# Patient Record
Sex: Male | Born: 1955 | Race: Black or African American | Hispanic: No | Marital: Married | Smoking: Current every day smoker
Health system: Southern US, Community
[De-identification: ages and names within clinical notes are randomized; demographics above are authoritative.]

## PROBLEM LIST (undated history)

## (undated) DIAGNOSIS — I10 Essential (primary) hypertension: Secondary | ICD-10-CM

## (undated) DIAGNOSIS — R7303 Prediabetes: Secondary | ICD-10-CM

## (undated) DIAGNOSIS — E785 Hyperlipidemia, unspecified: Secondary | ICD-10-CM

## (undated) DIAGNOSIS — N183 Chronic kidney disease, stage 3 (moderate): Secondary | ICD-10-CM

## (undated) HISTORY — DX: Prediabetes: R73.03

## (undated) HISTORY — DX: Essential (primary) hypertension: I10

## (undated) HISTORY — DX: Chronic kidney disease, stage 3 (moderate): N18.3

## (undated) HISTORY — DX: Hyperlipidemia, unspecified: E78.5

## (undated) HISTORY — PX: OTHER SURGICAL HISTORY: SHX169

---

## 2015-04-17 ENCOUNTER — Encounter: Payer: Self-pay | Admitting: Family Medicine

## 2015-04-17 ENCOUNTER — Ambulatory Visit (INDEPENDENT_AMBULATORY_CARE_PROVIDER_SITE_OTHER): Payer: BLUE CROSS/BLUE SHIELD | Admitting: Family Medicine

## 2015-04-17 ENCOUNTER — Ambulatory Visit (INDEPENDENT_AMBULATORY_CARE_PROVIDER_SITE_OTHER): Payer: BLUE CROSS/BLUE SHIELD

## 2015-04-17 VITALS — BP 132/75 | HR 91 | Ht 74.0 in | Wt 233.0 lb

## 2015-04-17 DIAGNOSIS — M1990 Unspecified osteoarthritis, unspecified site: Secondary | ICD-10-CM | POA: Insufficient documentation

## 2015-04-17 DIAGNOSIS — Z8619 Personal history of other infectious and parasitic diseases: Secondary | ICD-10-CM | POA: Diagnosis not present

## 2015-04-17 DIAGNOSIS — R300 Dysuria: Secondary | ICD-10-CM | POA: Diagnosis not present

## 2015-04-17 DIAGNOSIS — R109 Unspecified abdominal pain: Secondary | ICD-10-CM | POA: Diagnosis not present

## 2015-04-17 DIAGNOSIS — R935 Abnormal findings on diagnostic imaging of other abdominal regions, including retroperitoneum: Secondary | ICD-10-CM

## 2015-04-17 DIAGNOSIS — K5900 Constipation, unspecified: Secondary | ICD-10-CM | POA: Insufficient documentation

## 2015-04-17 LAB — POCT URINALYSIS DIPSTICK
Bilirubin, UA: NEGATIVE
Glucose, UA: NEGATIVE
KETONES UA: NEGATIVE
LEUKOCYTES UA: NEGATIVE
Nitrite, UA: NEGATIVE
PROTEIN UA: NEGATIVE
Spec Grav, UA: >=1.03
Urobilinogen, UA: 0.2
pH, UA: 5.5

## 2015-04-17 MED ORDER — CELECOXIB 200 MG PO CAPS: 200.0000 mg | ORAL_CAPSULE | Freq: Two times a day (BID) | ORAL | Status: DC | PRN

## 2015-04-17 NOTE — Progress Notes (Signed)
CC: Quantae Martel is a 59 y.o. male is here for Establish Care   Subjective: HPI:  Pleasant 59 year old here to establish care  Complains of dysuria that has been present for the past 2 months. It's described as a tingling in the left flank but no penile pain. He's also had a soreness in the left flank where this tingling occurs that is present throughout the day. He's had a history of kidney stones and he thinks that this pain might feel similar to stones in the past. Pain does not radiate. Nothing other than above seems to make pain better or worse. No interventions as of yet. Denies blood in his urine or change in the appearance of his urine.  He tells me that in his 75s he had hepatitis but is unsure of what subtype this was. He tells me that was treated with a single injection of some sort of medication. Since then he denies any persistent right upper quadrant pain fevers or jaundice. He does not think that he's been tested for hepatitis in the last decade.  Reports a history of osteoarthritis in the shoulders, hands, knees, and locations in his back. He seen neurosurgery and orthopedics in the past however from what he remembers there wasn't much intervention to be done other than medications. He's taken naproxen and ibuprofen neither of which have significantly improved his pain. Pain is worse after inactivity or a few days after any strainers activity like playing basketball with his family or doing yard work. He denies any joint swelling redness or warmth.  Complains of recent issues with constipation. He tells me it is not uncommon to go for 2-3 days without having a bowel movement. Symptoms are improved if he drinks dissolved Epsom salts however he cannot stand the taste of this and uses it infrequently. No other interventions as of yet. He is not certain how long the constipation is been present but its at least been daily for the last 3 months. Denies any melena or change in the caliber  of his stool.   Review of Systems - General ROS: negative for - chills, fever, night sweats, weight gain or weight loss Ophthalmic ROS: negative for - decreased vision Psychological ROS: negative for - anxiety  ENT ROS: negative for - hearing change, nasal congestion, tinnitus or allergies Hematological and Lymphatic ROS: negative for - bleeding problems, bruising or swollen lymph nodes Breast ROS: negative Respiratory ROS: no cough, shortness of breath, or wheezing Cardiovascular ROS: no chest pain or dyspnea on exertion Gastrointestinal ROS: no black or bloody stools Genito-Urinary ROS: negative for - genital discharge, genital ulcers, incontinence or abnormal bleeding from genitals Musculoskeletal ROS: negative for - joint pain or muscle pain other than that described above Neurological ROS: negative for - headaches or memory loss Dermatological ROS: negative for lumps, mole changes, rash and skin lesion changes  Past Medical History  Diagnosis Date  . Hypertension   . Hyperlipidemia     History reviewed. No pertinent past surgical history. Family History  Problem Relation Age of Onset  . Alcoholism    . Depression    . Hyperlipidemia    . Hypertension      History   Social History  . Marital Status: N/A    Spouse Name: N/A  . Number of Children: N/A  . Years of Education: N/A   Occupational History  . Not on file.   Social History Main Topics  . Smoking status: Never Smoker   . Smokeless  tobacco: Not on file  . Alcohol Use: No  . Drug Use: No  . Sexual Activity: No   Other Topics Concern  . Not on file   Social History Narrative  . No narrative on file     Objective: BP 132/75 mmHg  Pulse 91  Ht 6\' 2"  (1.88 m)  Wt 233 lb (105.688 kg)  BMI 29.90 kg/m2  General: Alert and Oriented, No Acute Distress HEENT: Pupils equal, round, reactive to light. Conjunctivae clear.  Moist mucous membranes pharynx unremarkable Lungs: Clear to auscultation bilaterally,  no wheezing/ronchi/rales.  Comfortable work of breathing. Good air movement. Cardiac: Regular rate and rhythm. Normal S1/S2.  No murmurs, rubs, nor gallops.   Abdomen: Normal bowel sounds, soft without palpable masses rebound tenderness nor guarding. Diffuse abdominal discomfort but mild in severity with deep palpation. Obese. Extremities: No peripheral edema.  Strong peripheral pulses.  Mental Status: No depression, anxiety, nor agitation. Skin: Warm and dry.  Assessment & Plan: Lorella NimrodHarvey was seen today for establish care.  Diagnoses and all orders for this visit:  Dysuria Orders: -     Urinalysis Dipstick -     Urine Culture  History of hepatitis Orders: -     Hepatitis C antibody -     Hepatitis B Surface AntiBODY -     COMPLETE METABOLIC PANEL WITH GFR  Left flank pain Orders: -     DG Abd 2 Views; Future  Osteoarthritis, unspecified osteoarthritis type, unspecified site Orders: -     celecoxib (CELEBREX) 200 MG capsule; Take 1 capsule (200 mg total) by mouth 2 (two) times daily as needed. Joint/Back pain.  Constipation, unspecified constipation type   Dysuria: Urinalysis obtained low suspicion for UTI, on chart review these had trace lysed blood on urinalysis stemming back to 2012 however a microscopy study showed 0 red blood cells.  Low suspicion for his trace lysed blood result today reflecting UTI. Encouraged increase hydration due to high specific gravity History of hepatitis:: Checking liver function and screening for hepatitis B and C infections Left flank pain: Obtaining plain films of the abdomen to screen for nephrolithiasis Osteoporosis: Begin trial of Celebrex, requesting outside records for films and imaging Constipation: Plain films of the abdomen to rule out obstruction and if this is normal will provide with linzess  Return if symptoms worsen or fail to improve.

## 2015-04-18 ENCOUNTER — Telehealth: Payer: Self-pay | Admitting: Family Medicine

## 2015-04-18 ENCOUNTER — Encounter: Payer: Self-pay | Admitting: Family Medicine

## 2015-04-18 DIAGNOSIS — F32A Depression, unspecified: Secondary | ICD-10-CM | POA: Insufficient documentation

## 2015-04-18 DIAGNOSIS — N183 Chronic kidney disease, stage 3 unspecified: Secondary | ICD-10-CM | POA: Insufficient documentation

## 2015-04-18 DIAGNOSIS — F329 Major depressive disorder, single episode, unspecified: Secondary | ICD-10-CM | POA: Insufficient documentation

## 2015-04-18 DIAGNOSIS — N200 Calculus of kidney: Secondary | ICD-10-CM

## 2015-04-18 DIAGNOSIS — M1990 Unspecified osteoarthritis, unspecified site: Secondary | ICD-10-CM | POA: Insufficient documentation

## 2015-04-18 DIAGNOSIS — I10 Essential (primary) hypertension: Secondary | ICD-10-CM | POA: Insufficient documentation

## 2015-04-18 HISTORY — DX: Chronic kidney disease, stage 3 unspecified: N18.30

## 2015-04-18 LAB — COMPLETE METABOLIC PANEL WITH GFR
ALBUMIN: 4.6 g/dL (ref 3.5–5.2)
ALT: 28 U/L (ref 0–53)
AST: 24 U/L (ref 0–37)
Alkaline Phosphatase: 55 U/L (ref 39–117)
BUN: 30 mg/dL — ABNORMAL HIGH (ref 6–23)
CO2: 26 mEq/L (ref 19–32)
Calcium: 10.1 mg/dL (ref 8.4–10.5)
Chloride: 104 mEq/L (ref 96–112)
Creat: 1.39 mg/dL — ABNORMAL HIGH (ref 0.50–1.35)
GFR, EST AFRICAN AMERICAN: 64 mL/min
GFR, Est Non African American: 55 mL/min — ABNORMAL LOW
Glucose, Bld: 84 mg/dL (ref 70–99)
POTASSIUM: 4.5 meq/L (ref 3.5–5.3)
SODIUM: 139 meq/L (ref 135–145)
TOTAL PROTEIN: 7.9 g/dL (ref 6.0–8.3)
Total Bilirubin: 0.5 mg/dL (ref 0.2–1.2)

## 2015-04-18 LAB — HEPATITIS C ANTIBODY: HCV Ab: NEGATIVE

## 2015-04-18 LAB — HEPATITIS B SURFACE ANTIBODY,QUALITATIVE: HEP B S AB: NEGATIVE

## 2015-04-18 MED ORDER — LINACLOTIDE 145 MCG PO CAPS: 145.0000 ug | ORAL_CAPSULE | Freq: Every day | ORAL | Status: DC

## 2015-04-18 NOTE — Telephone Encounter (Signed)
Sue Lushndrea, Will you please let patient know that his xray was suggestive of a kidney stone in the left ureter where it empties into the bladder.  I'd recommend he have a ultrasound performed to evaluate if this is causing any significant obstruction, an order has been placed for this to be performed at our [place] location. Also, there was no bowel obstruction therefore I'd recommend starting a medication called linzess to help with bowel regularity, a Rx was sent to [facility] in [place]. F/U will be determined based on the ultrasound.

## 2015-04-18 NOTE — Telephone Encounter (Signed)
Pt.notified

## 2015-04-19 LAB — URINE CULTURE
Colony Count: NO GROWTH
ORGANISM ID, BACTERIA: NO GROWTH

## 2015-04-21 ENCOUNTER — Telehealth: Payer: Self-pay | Admitting: Family Medicine

## 2015-04-21 NOTE — Telephone Encounter (Signed)
Received fax for pa on celecoxib 200 mg sent through cover my meds and medication is approved. - CF

## 2015-04-21 NOTE — Telephone Encounter (Signed)
Celecoxib is approved from 03/22/2015 - 04/20/2016. Case # [id number]. - CF

## 2015-04-23 ENCOUNTER — Ambulatory Visit (INDEPENDENT_AMBULATORY_CARE_PROVIDER_SITE_OTHER): Payer: BLUE CROSS/BLUE SHIELD

## 2015-04-23 DIAGNOSIS — N200 Calculus of kidney: Secondary | ICD-10-CM | POA: Diagnosis not present

## 2015-04-24 ENCOUNTER — Telehealth: Payer: Self-pay | Admitting: Family Medicine

## 2015-04-24 DIAGNOSIS — N281 Cyst of kidney, acquired: Secondary | ICD-10-CM

## 2015-04-24 NOTE — Telephone Encounter (Signed)
Andrew Hanson, Will you please let patient know that his ultrasound showed that he no longer has any signs of a kidney stone causing an obstruction.  There were signs of some cysts on both kidneys which are very common and usually benign however the radiologist viewing his images has recommended a non-urgent MRI to further characterize these cysts and to ensure they are nothing to worry about.  I'll work on putting in an order for this.  (Can you also call radiology and ask them how to put in an order for a "Renal protocol MRI", I can't seem to find a clear order for this in Epic)

## 2015-04-24 NOTE — Telephone Encounter (Signed)
Pt.notified

## 2015-04-25 NOTE — Telephone Encounter (Signed)
Order placed. (i had called imaging and they advised on how to place order)

## 2015-04-25 NOTE — Telephone Encounter (Signed)
Sue Lushndrea, Can you also call radiology and ask them how to put in an order for a "Renal protocol MRI", I can't seem to find a clear order for this in Epic

## 2015-04-28 ENCOUNTER — Telehealth: Payer: Self-pay | Admitting: *Deleted

## 2015-04-28 ENCOUNTER — Ambulatory Visit: Payer: BLUE CROSS/BLUE SHIELD

## 2015-04-28 MED ORDER — GADOBENATE DIMEGLUMINE 529 MG/ML IV SOLN
20.0000 mL | Freq: Once | INTRAVENOUS | Status: AC | PRN
  Administered 2015-04-28: 20 mL via INTRAVENOUS

## 2015-04-28 NOTE — Telephone Encounter (Signed)
error 

## 2015-04-30 ENCOUNTER — Telehealth: Payer: Self-pay | Admitting: Family Medicine

## 2015-04-30 DIAGNOSIS — R109 Unspecified abdominal pain: Secondary | ICD-10-CM | POA: Insufficient documentation

## 2015-04-30 NOTE — Telephone Encounter (Signed)
Referral placed per result note (MRI)

## 2015-05-06 ENCOUNTER — Ambulatory Visit: Payer: BLUE CROSS/BLUE SHIELD | Admitting: Physical Therapy

## 2015-06-26 ENCOUNTER — Other Ambulatory Visit: Payer: Self-pay | Admitting: *Deleted

## 2015-06-26 MED ORDER — LISINOPRIL 20 MG PO TABS: 20.0000 mg | ORAL_TABLET | Freq: Every day | ORAL | Status: DC

## 2015-07-07 ENCOUNTER — Ambulatory Visit: Payer: BLUE CROSS/BLUE SHIELD | Admitting: Family Medicine

## 2015-07-15 ENCOUNTER — Ambulatory Visit (INDEPENDENT_AMBULATORY_CARE_PROVIDER_SITE_OTHER): Payer: BLUE CROSS/BLUE SHIELD | Admitting: Family Medicine

## 2015-07-15 ENCOUNTER — Ambulatory Visit (INDEPENDENT_AMBULATORY_CARE_PROVIDER_SITE_OTHER): Payer: BLUE CROSS/BLUE SHIELD

## 2015-07-15 ENCOUNTER — Encounter: Payer: Self-pay | Admitting: Family Medicine

## 2015-07-15 DIAGNOSIS — R109 Unspecified abdominal pain: Secondary | ICD-10-CM | POA: Diagnosis not present

## 2015-07-15 DIAGNOSIS — M25511 Pain in right shoulder: Secondary | ICD-10-CM | POA: Diagnosis not present

## 2015-07-15 DIAGNOSIS — I10 Essential (primary) hypertension: Secondary | ICD-10-CM

## 2015-07-15 DIAGNOSIS — G5602 Carpal tunnel syndrome, left upper limb: Secondary | ICD-10-CM

## 2015-07-15 LAB — LIPID PANEL
CHOLESTEROL: 176 mg/dL (ref 125–200)
HDL: 26 mg/dL — ABNORMAL LOW (ref >=40)
LDL CALC: 85 mg/dL (ref <130)
Total CHOL/HDL Ratio: 6.8 Ratio — ABNORMAL HIGH (ref <=5.0)
Triglycerides: 327 mg/dL — ABNORMAL HIGH (ref <150)
VLDL: 65 mg/dL — AB (ref <30)

## 2015-07-15 MED ORDER — LISINOPRIL 20 MG PO TABS: 20.0000 mg | ORAL_TABLET | Freq: Every day | ORAL | Status: DC
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
## 2015-07-16 ENCOUNTER — Telehealth: Payer: Self-pay | Admitting: Family Medicine

## 2015-07-16 DIAGNOSIS — E781 Pure hyperglyceridemia: Secondary | ICD-10-CM | POA: Insufficient documentation

## 2015-07-16 NOTE — Telephone Encounter (Signed)
Sue Lush, Will you please let patient know that his LDL cholesterol is well controlled but his triglyceride level was moderately elevated which can lead to liver and pancreatic inflammation over time.  This can be improved with an OTC fish oil capsule at a dose of 1g twice a day. We'll want to recheck this in three months.

## 2015-07-16 NOTE — Telephone Encounter (Signed)
Left message on vm

## 2015-10-16 ENCOUNTER — Other Ambulatory Visit: Payer: Self-pay

## 2015-10-16 MED ORDER — OMEPRAZOLE 20 MG PO CPDR: 20.0000 mg | DELAYED_RELEASE_CAPSULE | Freq: Two times a day (BID) | ORAL | Status: DC

## 2015-11-17 ENCOUNTER — Other Ambulatory Visit: Payer: Self-pay

## 2015-11-17 MED ORDER — OMEPRAZOLE 20 MG PO CPDR: 20.0000 mg | DELAYED_RELEASE_CAPSULE | Freq: Two times a day (BID) | ORAL | Status: DC

## 2016-01-07 IMAGING — MR MR ABDOMEN WO/W CM
17 of 18 series · 43 of 48 positions shown · IV contrast (20 ML MULTIHANCE)
Comparison: None.

CLINICAL DATA: Indeterminate renal lesion on ultrasound
examination. Left rib pain in left abdominal pain.

EXAM:
MRI ABDOMEN WITHOUT AND WITH CONTRAST
TECHNIQUE: Multiplanar multisequence MR imaging of the abdomen was performed
both before and after the administration of intravenous contrast.
CONTRAST:  20mL MULTIHANCE GADOBENATE DIMEGLUMINE 529 MG/ML IV SOLN

[Series 2: cor ssfse / · coronal · 7.0mm · 1.48mm/px · 1 of 40 slices shown]
[im 1/40]
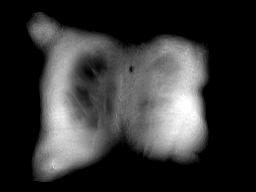

[Series 3: T2 · axial · 6.0mm · 1.48mm/px · 1 of 30 slices shown]
[im 1/30]
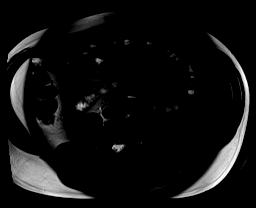

[Series 6: DWI · axial · 6.0mm · 2.00mm/px · z∈[-45,+193]mm · 4 of 102 slices shown (1 of 2)]
[im 1/102]
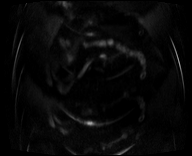
[im 34/102]
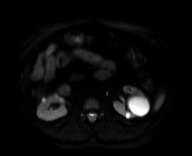
[im 68/102]
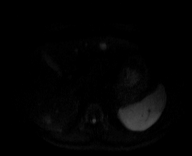
[im 102/102]
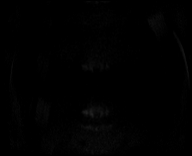

[Series 7: DWI · axial · 6.0mm · 2.00mm/px · z∈[-45,+193]mm · 2 of 34 slices shown (2 of 2)]
[im 1/34]
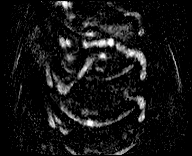
[im 34/34]
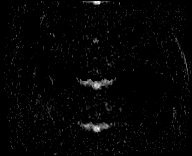

[Series 8: bSSFP · axial · 6.0mm · 0.74mm/px · z∈[-68,+184]mm · 2 of 36 slices shown]
[im 1/36]
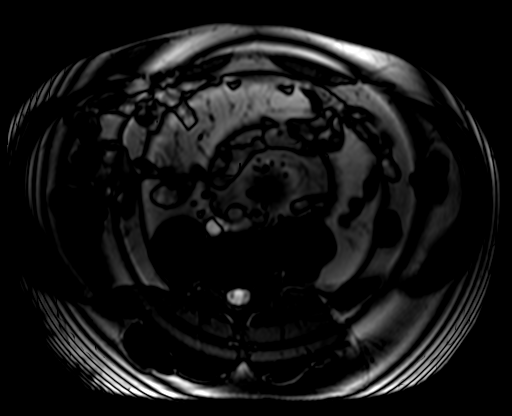
[im 36/36]
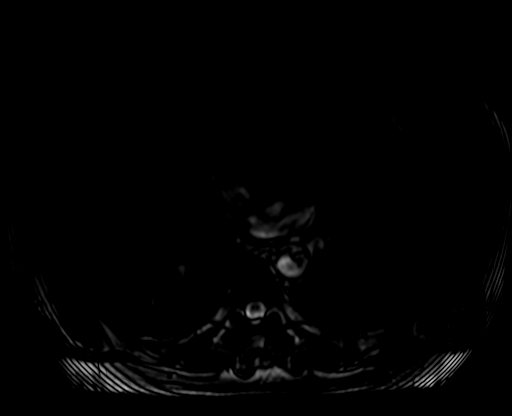

[Series 9: axial dynamic pre · axial · non-contrast · 4.0mm · 1.19mm/px · z∈[-68,+184]mm · 3 of 64 slices shown]
[im 1/64]
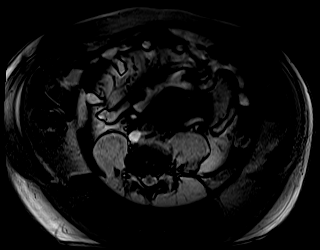
[im 32/64]
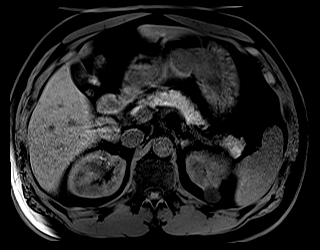
[im 64/64]
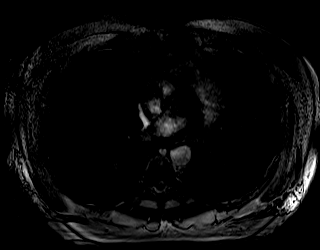

[Series 10: axial dynamic post · axial · 4.0mm · 1.19mm/px · z∈[-68,+184]mm · 3 of 64 slices shown (1 of 3)]
[im 1/64]
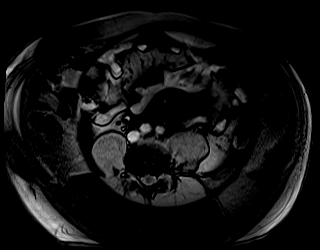
[im 32/64]
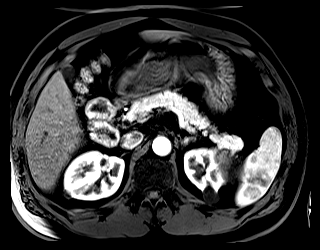
[im 64/64]
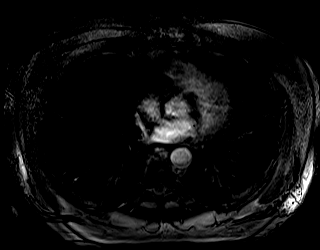

[Series 11: axial dynamic post · axial · 4.0mm · 1.19mm/px · z∈[-68,+184]mm · 3 of 64 slices shown (2 of 3)]
[im 1/64]
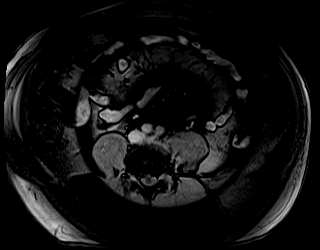
[im 32/64]
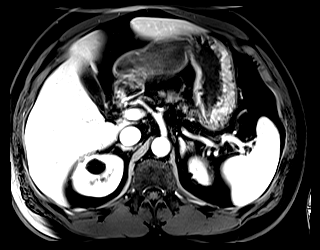
[im 64/64]
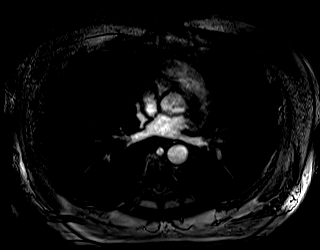

[Series 12: axial dynamic post · axial · 4.0mm · 1.19mm/px · z∈[-68,+184]mm · 3 of 64 slices shown (3 of 3)]
[im 1/64]
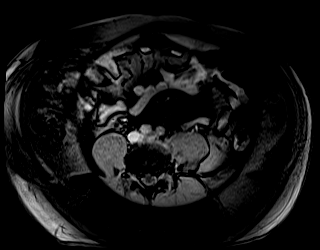
[im 32/64]
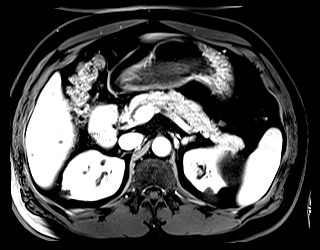
[im 64/64]
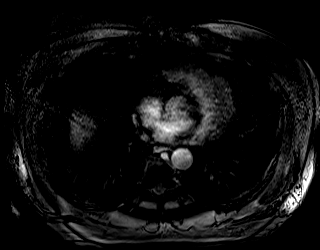

[Series 14: axial ssfse / · axial · 6.0mm · 1.19mm/px · z∈[-68,+184]mm · 2 of 36 slices shown]
[im 1/36]
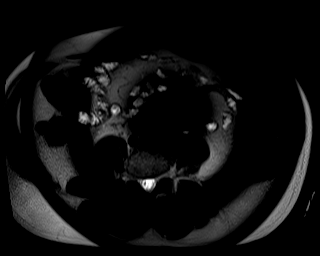
[im 36/36]
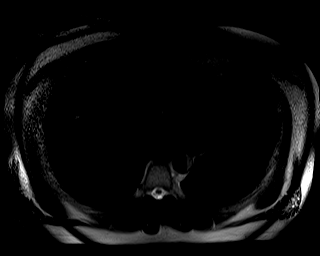

[Series 15: axial dynamic 3 · axial · 4.0mm · 1.19mm/px · z∈[-68,+184]mm · 3 of 64 slices shown]
[im 1/64]
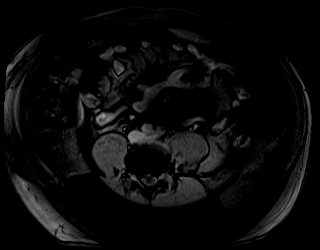
[im 32/64]
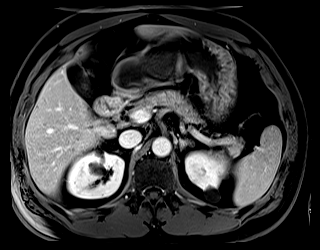
[im 64/64]
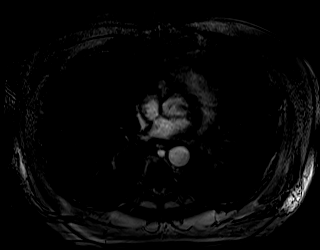

[Series 100: out of phase · axial · 6.0mm · 0.74mm/px · 1 of 30 slices shown]
[im 1/30]
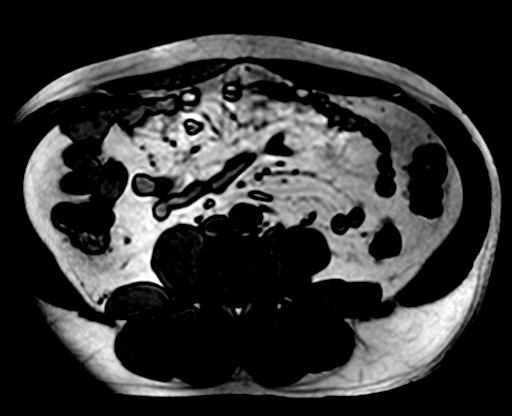

[Series 101: in phase · axial · 6.0mm · 0.74mm/px · z∈[-52,+156]mm · 3 of 60 slices shown]
[im 1/60]
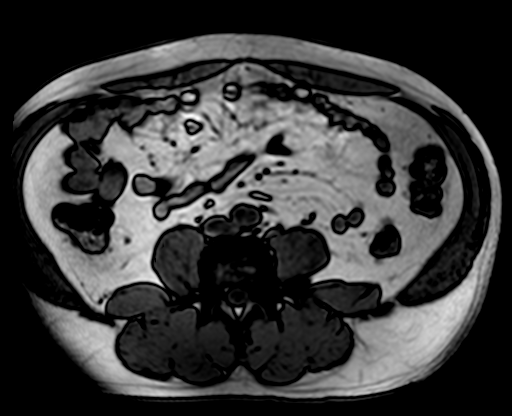
[im 30/60]
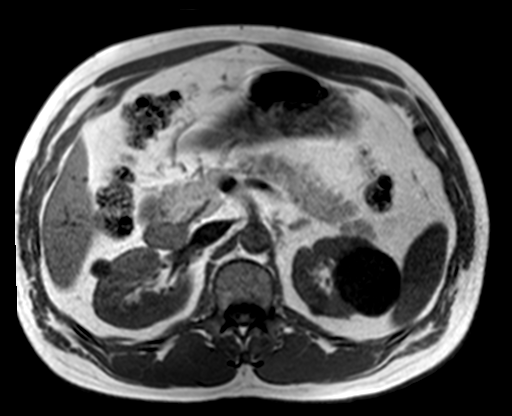
[im 60/60]
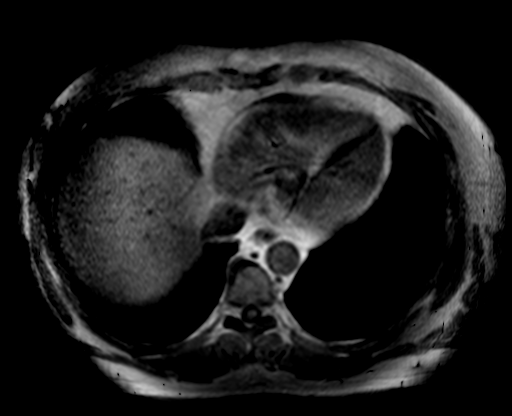

[Series 102: sub_s10-s9_1 · axial · 4.0mm · 1.19mm/px · z∈[-68,+184]mm · 3 of 64 slices shown]
[im 1/64]
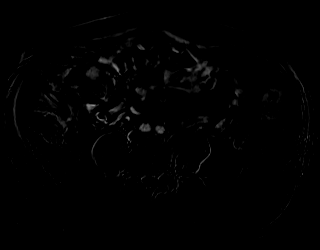
[im 32/64]
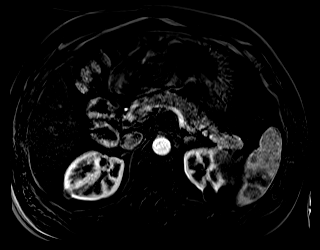
[im 64/64]
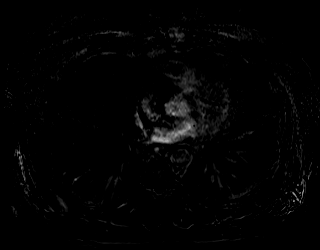

[Series 103: 45 sec sub · axial · 4.0mm · 1.19mm/px · z∈[-68,+184]mm · 3 of 64 slices shown]
[im 1/64]
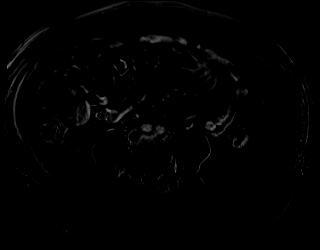
[im 32/64]
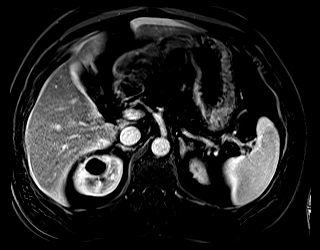
[im 64/64]
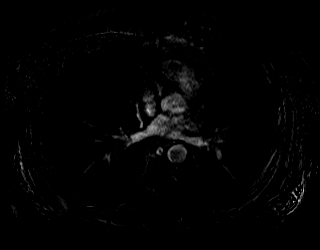

[Series 104: 90 sec sub · axial · 4.0mm · 1.19mm/px · z∈[-68,+184]mm · 3 of 64 slices shown]
[im 1/64]
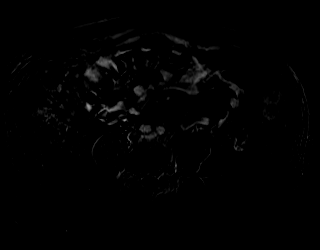
[im 32/64]
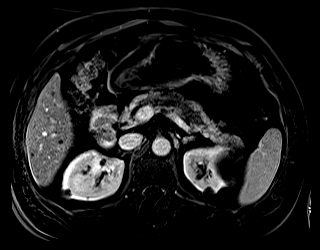
[im 64/64]
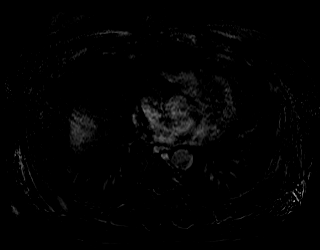

[Series 105: 3 min sub · axial · 4.0mm · 1.19mm/px · z∈[-68,+184]mm · 3 of 64 slices shown]
[im 1/64]
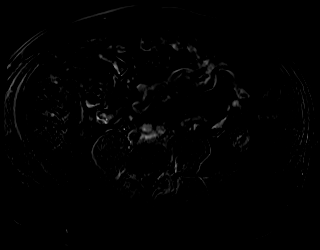
[im 32/64]
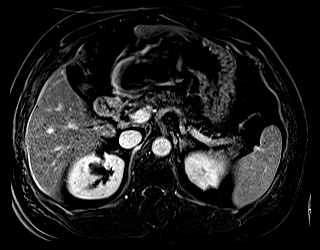
[im 64/64]
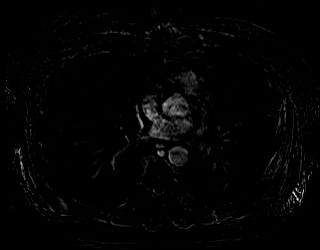

[43 of 48 positions shown; findings below may reference images not displayed]

FINDINGS: Lower chest:  Lung bases are clear.

Hepatobiliary: There multiple cystic lesions within the liver
parenchyma which demonstrate no post-contrast enhancement consistent
with benign hepatic cysts. No biliary duct dilatation. Gallbladder
is normal.

Pancreas: Normal pancreatic parenchymal intensity. No ductal
dilatation or inflammation.

Spleen: Normal spleen.

Adrenals/urinary tract: Adrenal glands are normal. There bilateral
round renal parenchymal lesions which have high signal intensity on
T2 weighted imaging and demonstrate no post-contrast enhancement
consistent with nonenhancing renal cysts.

Stomach/Bowel: Stomach and limited of the small bowel is
unremarkable

Vascular/Lymphatic: Abdominal aortic normal caliber. No
retroperitoneal periportal lymphadenopathy.

Musculoskeletal: No aggressive osseous lesion
IMPRESSION: 1. Numerous Bilateral nonenhancing Bosniak 1 renal cysts.
2. Numerous hepatic cyst.

## 2016-01-09 ENCOUNTER — Other Ambulatory Visit: Payer: Self-pay

## 2016-01-09 MED ORDER — OMEPRAZOLE 20 MG PO CPDR: 20.0000 mg | DELAYED_RELEASE_CAPSULE | Freq: Two times a day (BID) | ORAL | Status: DC

## 2016-01-14 ENCOUNTER — Encounter: Payer: Self-pay | Admitting: Family Medicine

## 2016-01-14 ENCOUNTER — Ambulatory Visit (INDEPENDENT_AMBULATORY_CARE_PROVIDER_SITE_OTHER): Payer: BLUE CROSS/BLUE SHIELD | Admitting: Family Medicine

## 2016-01-14 DIAGNOSIS — Z111 Encounter for screening for respiratory tuberculosis: Secondary | ICD-10-CM

## 2016-01-14 DIAGNOSIS — R05 Cough: Secondary | ICD-10-CM | POA: Diagnosis not present

## 2016-01-14 DIAGNOSIS — R61 Generalized hyperhidrosis: Secondary | ICD-10-CM

## 2016-01-14 DIAGNOSIS — R059 Cough, unspecified: Secondary | ICD-10-CM

## 2016-01-14 DIAGNOSIS — Z86010 Personal history of colonic polyps: Secondary | ICD-10-CM

## 2016-01-14 DIAGNOSIS — Z800 Family history of malignant neoplasm of digestive organs: Secondary | ICD-10-CM

## 2016-01-14 LAB — CBC WITH DIFFERENTIAL/PLATELET
BASOS PCT: 1 % (ref 0–1)
Basophils Absolute: 0 10*3/uL (ref 0.0–0.1)
Eosinophils Absolute: 0.1 10*3/uL (ref 0.0–0.7)
Eosinophils Relative: 2 % (ref 0–5)
HCT: 41.4 % (ref 39.0–52.0)
Hemoglobin: 14.3 g/dL (ref 13.0–17.0)
Lymphocytes Relative: 30 % (ref 12–46)
Lymphs Abs: 1.1 10*3/uL (ref 0.7–4.0)
MCH: 29.5 pg (ref 26.0–34.0)
MCHC: 34.5 g/dL (ref 30.0–36.0)
MCV: 85.4 fL (ref 78.0–100.0)
Monocytes Absolute: 0.2 10*3/uL (ref 0.1–1.0)
Monocytes Relative: 6 % (ref 3–12)
NEUTROS ABS: 2.3 10*3/uL (ref 1.7–7.7)
Neutrophils Relative %: 61 % (ref 43–77)
Platelets: 173 10*3/uL (ref 150–400)
RBC: 4.85 MIL/uL (ref 4.22–5.81)
RDW: 14.2 % (ref 11.5–15.5)
WBC: 3.8 10*3/uL — AB (ref 4.0–10.5)

## 2016-01-14 LAB — COMPLETE METABOLIC PANEL WITH GFR
ALT: 22 U/L (ref 9–46)
AST: 13 U/L (ref 10–35)
Albumin: 4 g/dL (ref 3.6–5.1)
Alkaline Phosphatase: 58 U/L (ref 40–115)
BUN: 17 mg/dL (ref 7–25)
CALCIUM: 9.6 mg/dL (ref 8.6–10.3)
CHLORIDE: 99 mmol/L (ref 98–110)
CO2: 26 mmol/L (ref 20–31)
Creat: 1.33 mg/dL (ref 0.70–1.33)
GFR, Est African American: 67 mL/min (ref >=60)
GFR, Est Non African American: 58 mL/min — ABNORMAL LOW (ref >=60)
GLUCOSE: 218 mg/dL — AB (ref 65–99)
POTASSIUM: 4.3 mmol/L (ref 3.5–5.3)
SODIUM: 136 mmol/L (ref 135–146)
Total Bilirubin: 0.6 mg/dL (ref 0.2–1.2)
Total Protein: 7.2 g/dL (ref 6.1–8.1)

## 2016-01-14 MED ORDER — BUDESONIDE-FORMOTEROL FUMARATE 160-4.5 MCG/ACT IN AERO: 2.0000 | INHALATION_SPRAY | Freq: Two times a day (BID) | RESPIRATORY_TRACT | Status: DC

## 2016-01-14 MED ORDER — TUBERCULIN PPD 5 UNIT/0.1ML ID SOLN
5.0000 [IU] | Freq: Once | INTRADERMAL | Status: DC
  Administered 2016-01-14: 5 [IU] via INTRADERMAL

## 2016-01-14 NOTE — Progress Notes (Signed)
CC: Andrew Hanson is a 60 y.o. male is here for Night Sweats and Cough   Subjective: HPI:  Over the past 3 months on a nightly basis he has been Waking up 2 night sweats. He finds himself covered with sweat, slightly chilly. He's tried changing sheets in sleeping attire but nothing seems to make symptoms better or worse. Over the past 2 weeks he's had a nonproductive cough with some shortness of breath but this had been absent up until 2 weeks ago. Denies any recent travel or known exposure to tuberculosis. He denies any chest pain. No interventions other than that described above. Denies unintentional weight loss or blood in sputum. He denies fevers, wheezing, skin changes, nausea or change in appetite  He has a family history of colon cancer. He had polyps removed 3 years ago which were benign and believes he is due for follow-up colonoscopy. He denies any constipation or diarrhea. No abdominal pain.  Review Of Systems Outlined In HPI  Past Medical History  Diagnosis Date  . Hypertension   . Hyperlipidemia     No past surgical history on file. Family History  Problem Relation Age of Onset  . Alcoholism    . Depression    . Hyperlipidemia    . Hypertension      Social History   Social History  . Marital Status: Married    Spouse Name: N/A  . Number of Children: N/A  . Years of Education: N/A   Occupational History  . Not on file.   Social History Main Topics  . Smoking status: Never Smoker   . Smokeless tobacco: Not on file  . Alcohol Use: No  . Drug Use: No  . Sexual Activity: No   Other Topics Concern  . Not on file   Social History Narrative     Objective: BP 135/86 mmHg  Pulse 90  Wt 253 lb (114.76 kg)  General: Alert and Oriented, No Acute Distress HEENT: Pupils equal, round, reactive to light. Conjunctivae clear.  Pharynx unremarkable Lungs: Clear to auscultation bilaterally, no wheezing/ronchi/rales.  Comfortable work of breathing. Good air  movement. Cardiac: Regular rate and rhythm. Normal S1/S2.  No murmurs, rubs, nor gallops.   Extremities: No peripheral edema.  Strong peripheral pulses.  Mental Status: No depression, anxiety, nor agitation. Skin: Warm and dry.  Assessment & Plan: Shadoe was seen today for night sweats and cough.  Diagnoses and all orders for this visit:  Night sweats -     CBC with Differential -     TSH -     COMPLETE METABOLIC PANEL WITH GFR -     PPD  Family history of colon cancer  Personal history of colonic polyps -     Ambulatory referral to Gastroenterology  Cough -     budesonide-formoterol (SYMBICORT) 160-4.5 MCG/ACT inhaler; Inhale 2 puffs into the lungs 2 (two) times daily.  PPD screening test -     tuberculin injection 5 Units; Inject 0.1 mLs (5 Units total) into the skin once.   differential remains broad for night sweats, will rule out tuberculosis with PPD test placed today, rule out hyperthyroidism obtaining white count with differential to screen for possible infection. Rule out metabolic abnormality.   cough: He was given samples of Symbicort due to suspicion of bronchitis with a history of smoking.  Return for Nurse Visit in 2-3 Days.

## 2016-01-15 ENCOUNTER — Telehealth: Payer: Self-pay | Admitting: Family Medicine

## 2016-01-15 DIAGNOSIS — R739 Hyperglycemia, unspecified: Secondary | ICD-10-CM

## 2016-01-15 LAB — TSH: TSH: 0.624 u[IU]/mL (ref 0.350–4.500)

## 2016-01-15 NOTE — Telephone Encounter (Signed)
Will you please let patient know that his blood work was normal other than a moderately elevated blood sugar. To further look in to this I'd recommend checking a three month average blood sugar (A1c), can you please see if the lab can add this on? If not I'll print off a new lab order.

## 2016-01-15 NOTE — Telephone Encounter (Signed)
PT NOTIFIED  

## 2016-01-16 ENCOUNTER — Encounter: Payer: Self-pay | Admitting: Family Medicine

## 2016-01-16 ENCOUNTER — Ambulatory Visit (INDEPENDENT_AMBULATORY_CARE_PROVIDER_SITE_OTHER): Payer: BLUE CROSS/BLUE SHIELD | Admitting: Family Medicine

## 2016-01-16 DIAGNOSIS — Z111 Encounter for screening for respiratory tuberculosis: Secondary | ICD-10-CM

## 2016-01-16 DIAGNOSIS — Z7689 Persons encountering health services in other specified circumstances: Secondary | ICD-10-CM

## 2016-01-16 LAB — HEMOGLOBIN A1C
Hgb A1c MFr Bld: 7.9 % — ABNORMAL HIGH (ref <5.7)
Mean Plasma Glucose: 180 mg/dL — ABNORMAL HIGH (ref <117)

## 2016-01-16 LAB — TB SKIN TEST
Induration: 0 mm
TB SKIN TEST: NEGATIVE

## 2016-01-16 NOTE — Progress Notes (Signed)
Patient was in office for PPD reading. Patient had 0 induration at the site on left lower arm. Ludy Messamore,CMA

## 2016-01-19 ENCOUNTER — Telehealth: Payer: Self-pay | Admitting: Family Medicine

## 2016-01-19 DIAGNOSIS — E119 Type 2 diabetes mellitus without complications: Secondary | ICD-10-CM

## 2016-01-19 DIAGNOSIS — R7303 Prediabetes: Secondary | ICD-10-CM | POA: Insufficient documentation

## 2016-01-19 HISTORY — DX: Prediabetes: R73.03

## 2016-01-19 MED ORDER — METFORMIN HCL 1000 MG PO TABS: ORAL_TABLET | ORAL | Status: DC

## 2016-01-19 NOTE — Addendum Note (Signed)
Addended by: Annaliza Zia on: 01/19/2016 01:46 PM   Modules accepted: Level of Service  

## 2016-01-19 NOTE — Telephone Encounter (Signed)
Will you please let patient know that his 3 month average blood sugar was in the type 2 diabetic range.  To help address this I'd recommend he start on a daily dose of metformin that I've sent to his [facility]. I'd recommend f/u in 2-3 months.

## 2016-01-19 NOTE — Telephone Encounter (Signed)
Pt advised.

## 2016-01-26 ENCOUNTER — Other Ambulatory Visit: Payer: Self-pay

## 2016-01-26 MED ORDER — LISINOPRIL 20 MG PO TABS: 20.0000 mg | ORAL_TABLET | Freq: Every day | ORAL | Status: DC

## 2016-01-30 ENCOUNTER — Telehealth: Payer: Self-pay

## 2016-01-30 ENCOUNTER — Other Ambulatory Visit: Payer: Self-pay

## 2016-01-30 MED ORDER — OMEPRAZOLE 20 MG PO CPDR: 20.0000 mg | DELAYED_RELEASE_CAPSULE | Freq: Two times a day (BID) | ORAL | Status: DC

## 2016-01-30 NOTE — Telephone Encounter (Signed)
Sent new Rx.

## 2016-02-10 ENCOUNTER — Emergency Department (INDEPENDENT_AMBULATORY_CARE_PROVIDER_SITE_OTHER): Payer: BLUE CROSS/BLUE SHIELD

## 2016-02-10 ENCOUNTER — Encounter: Payer: Self-pay | Admitting: *Deleted

## 2016-02-10 ENCOUNTER — Emergency Department (INDEPENDENT_AMBULATORY_CARE_PROVIDER_SITE_OTHER)
Admission: EM | Admit: 2016-02-10 | Discharge: 2016-02-10 | Disposition: A | Discharge status: Home / Self Care | Payer: BLUE CROSS/BLUE SHIELD | Source: Home / Self Care | Attending: Family Medicine | Admitting: Family Medicine

## 2016-02-10 DIAGNOSIS — N200 Calculus of kidney: Secondary | ICD-10-CM

## 2016-02-10 DIAGNOSIS — M549 Dorsalgia, unspecified: Secondary | ICD-10-CM

## 2016-02-10 DIAGNOSIS — M5441 Lumbago with sciatica, right side: Secondary | ICD-10-CM | POA: Diagnosis not present

## 2016-02-10 DIAGNOSIS — M545 Low back pain: Secondary | ICD-10-CM

## 2016-02-10 DIAGNOSIS — M546 Pain in thoracic spine: Secondary | ICD-10-CM

## 2016-02-10 MED ORDER — MELOXICAM 7.5 MG PO TABS: 7.5000 mg | ORAL_TABLET | Freq: Every day | ORAL | Status: DC

## 2016-02-10 MED ORDER — HYDROCODONE-ACETAMINOPHEN 5-325 MG PO TABS: 1.0000 | ORAL_TABLET | Freq: Four times a day (QID) | ORAL | Status: DC | PRN

## 2016-02-10 NOTE — ED Notes (Signed)
Pt c/o low back pain x 2 weeks without injury. Possible h/o arthritis in his back. Pain worse when laying down, radiates down right hip at times. Declined any pain medication in-house.

## 2016-02-10 NOTE — ED Provider Notes (Signed)
CSN: [id number]     Arrival date & time 02/10/16  1016 History   First MD Initiated Contact with Patient 02/10/16 1040     Chief Complaint  Patient presents with  . Back Pain   (Consider location/radiation/quality/duration/timing/severity/associated sxs/prior Treatment) HPI  The pt is a 60yo male presenting to [facility] with c/o gradually worsening mid to lower bilateral back pain that started about 2 weeks ago. Pain is aching and sore, worse with certain movements and certain positions such as lying flat or on his Right side. Pain occasionally radiates into his Right upper leg.  Pain is 7/10 at worst. He has been taking ibuprofen with minimal relief. He was told a while back that he had severe arthritis in his neck and upper back but has not had problems with pain until recently. Denies change in bowel or bladder habits. Denies numbness in legs or groin. Denies fever, chills, n/v/d.  Denies known injury including no heavy lifting or falls.   Past Medical History  Diagnosis Date  . Hypertension   . Hyperlipidemia    History reviewed. No pertinent past surgical history. Family History  Problem Relation Age of Onset  . Alcoholism    . Depression    . Hyperlipidemia    . Hypertension     Social History  Substance Use Topics  . Smoking status: Never Smoker   . Smokeless tobacco: None  . Alcohol Use: No    Review of Systems  Gastrointestinal: Negative for nausea, vomiting and abdominal pain.  Genitourinary: Negative for dysuria, frequency and flank pain.  Musculoskeletal: Positive for myalgias and back pain. Negative for arthralgias and gait problem.  Skin: Negative for color change, rash and wound.  Neurological: Negative for weakness and numbness.    Allergies  Statins  Home Medications   Prior to Admission medications   Medication Sig Start Date End Date Taking? Authorizing Provider  Brexpiprazole 0.5 MG TABS Take by mouth. Starter pack    Historical Provider, MD   budesonide-formoterol (SYMBICORT) 160-4.5 MCG/ACT inhaler Inhale 2 puffs into the lungs 2 (two) times daily. 01/14/16   Laren Boom, DO  celecoxib (CELEBREX) 200 MG capsule Take 1 capsule (200 mg total) by mouth 2 (two) times daily as needed. Joint/Back pain. 04/17/15   Laren Boom, DO  Cholecalciferol (VITAMIN D3) 2000 UNITS TABS Take by mouth 2 (two) times daily.    Historical Provider, MD  HYDROcodone-acetaminophen (NORCO/VICODIN) 5-325 MG tablet Take 1 tablet by mouth every 6 (six) hours as needed for moderate pain or severe pain. 02/10/16   Junius Finner, PA-C  Linaclotide (LINZESS) 145 MCG CAPS capsule Take 1 capsule (145 mcg total) by mouth daily. For constipation. 04/18/15   Laren Boom, DO  lisdexamfetamine (VYVANSE) 50 MG capsule  08/05/13   Historical Provider, MD  lisinopril (PRINIVIL,ZESTRIL) 20 MG tablet Take 1 tablet (20 mg total) by mouth daily. 01/26/16   Laren Boom, DO  meloxicam (MOBIC) 7.5 MG tablet Take 1-2 tablets (7.5-15 mg total) by mouth daily. For at least 7 days 02/10/16   Junius Finner, PA-C  metFORMIN (GLUCOPHAGE) 1000 MG tablet One tablet by mouth every evening for blood sugar control. 01/19/16 01/18/17  Laren Boom, DO  naproxen (NAPROSYN) 500 MG tablet take 1 tablet by mouth twice a day with food if needed for headache 08/29/14   Historical Provider, MD  Omega-3 Fatty Acids (FISH OIL) 1000 MG CAPS One by mouth twice a day with meals. 07/16/15   Laren Boom, DO  omeprazole (PRILOSEC) 20  MG capsule Take 1 capsule (20 mg total) by mouth 2 (two) times daily before a meal. 01/30/16   Sean Hommel, DO  sertraline (ZOLOFT) 100 MG tablet Take 100 mg by mouth.    Historical Provider, MD   Meds Ordered and Administered this Visit  Medications - No data to display  BP 123/72 mmHg  Pulse 74  Temp(Src) 97.5 F (36.4 C) (Oral)  Resp 14  Wt 250 lb (113.399 kg)  SpO2 99% No data found.   Physical Exam  Constitutional: He is oriented to person, place, and time. He appears  well-developed and well-nourished.  HENT:  Head: Normocephalic and atraumatic.  Eyes: EOM are normal.  Neck: Normal range of motion.  Cardiovascular: Normal rate.   Pulmonary/Chest: Effort normal.  Musculoskeletal: Normal range of motion. He exhibits tenderness. He exhibits no edema.  Mild tenderness along thoracic and lumbar spine, worse along lumbar muscles.  Full ROM upper and lower extremities with with 5/5 strength bilaterally.  Negative straight leg raise.  Neurological: He is alert and oriented to person, place, and time.  Reflex Scores:      Patellar reflexes are 2+ on the right side and 2+ on the left side. Normal gait.  Skin: Skin is warm and dry. No rash noted. No erythema.  Psychiatric: He has a normal mood and affect. His behavior is normal.  Nursing note and vitals reviewed.   ED Course  Procedures (including critical care time)  Labs Review Labs Reviewed - No data to display  Imaging Review Dg Thoracic Spine 2 View  02/10/2016  CLINICAL DATA:  Low back pain for 3 weeks EXAM: THORACIC SPINE 2 VIEWS COMPARISON:  None. FINDINGS: Three views of thoracic spine submitted. No acute fracture or subluxation. Alignment and vertebral body heights are preserved. Minimal degenerative changes with anterior spurring lower thoracic spine. IMPRESSION: No acute fracture or subluxation. Minimal degenerative changes lower thoracic spine. Electronically Signed   By: Natasha Mead M.D.   On: 02/10/2016 11:06   Dg Lumbar Spine Complete  02/10/2016  CLINICAL DATA:  60 year old male with mid to the lower lumbar back pain greater on the left for 3 weeks. No known injury. Initial encounter. EXAM: LUMBAR SPINE - COMPLETE 4+ VIEW COMPARISON:  Abdomen radiographs 04/17/2015. Abdomen MRI 04/28/2015. FINDINGS: Multiple angular renal calculi on the left, the largest 8-9 mm in size. Occasional right renal calculi up to 5 mm. No definite calculus along the course of either ureter. Non obstructed bowel gas  pattern. Normal lumbar segmentation. Straightening of lordosis, otherwise normal vertebral height and alignment. Mild to moderate disc space loss and endplate spurring at L5-S1. Mild endplate spurring elsewhere. No pars fracture. Sacral ala and SI joints within normal limits. Visible lower thoracic levels appear intact. IMPRESSION: 1. No acute osseous abnormality identified in the lumbar spine. Disc and endplate degeneration maximal at L5-S1. 2. Extensive left greater than right nephrolithiasis. If acute renal colic is suspected noncontrast CT Abdomen and Pelvis would best evaluate further. Electronically Signed   By: Odessa Fleming M.D.   On: 02/10/2016 11:04      MDM   1. Mid back pain   2. Bilateral low back pain with right-sided sciatica   3. Nephrolithiasis    Pt c/o mid to lower back pain that radiates down his Right upper leg.  Pain started 2 weeks ago. Hx of arthritis but no known injury. Denies change in bowel or bladder habits.  No red flag symptoms.  Plain films: no acute findings.  Minimal degenerative changes in lower thoracic spine. Disc and endplate degeneration maximal at L5-S1.   Extensive Left greater than Right nephrolithiasis.    Discussed findings with pt.  Pt declined further workup of kidney stones via U/S or CT today.  Will try conservative treatment with pain medication and home exercises for back.  Encouraged pt to f/u with PCP for recheck of back pain and further evaluation of kidney stones if pain not improving.  Discussed symptoms that warrant emergent care in the ED. Patient verbalized understanding and agreement with treatment plan.     Junius Finner, PA-C 02/10/16 1151

## 2016-02-10 NOTE — Discharge Instructions (Signed)
Norco/Vicodin (hydrocodone-acetaminophen) is a narcotic pain medication, do not combine these medications with others containing tylenol. While taking, do not drink alcohol, drive, or perform any other activities that requires focus while taking these medications.   Meloxicam (Mobic) is an antiinflammatory to help with pain and inflammation.  Do not take ibuprofen, Advil, Aleve, or any other medications that contain NSAIDs while taking meloxicam as this may cause stomach upset or even ulcers if taken in large amounts for an extended period of time.    Back Pain, Adult Back pain is very common. The pain often gets better over time. The cause of back pain is usually not dangerous. Most people can learn to manage their back pain on their own.  HOME CARE  Watch your back pain for any changes. The following actions may help to lessen any pain you are feeling: 1. Stay active. Start with short walks on flat ground if you can. Try to walk farther each day. 2. Exercise regularly as told by your doctor. Exercise helps your back heal faster. It also helps avoid future injury by keeping your muscles strong and flexible. 3. Do not sit, drive, or stand in one place for more than 30 minutes. 4. Do not stay in bed. Resting more than 1-2 days can slow down your recovery. 5. Be careful when you bend or lift an object. Use good form when lifting: 1. Bend at your knees. 2. Keep the object close to your body. 3. Do not twist. 6. Sleep on a firm mattress. Lie on your side, and bend your knees. If you lie on your back, put a pillow under your knees. 7. Take medicines only as told by your doctor. 8. Put ice on the injured area. 1. Put ice in a plastic bag. 2. Place a towel between your skin and the bag. 3. Leave the ice on for 20 minutes, 2-3 times a day for the first 2-3 days. After that, you can switch between ice and heat packs. 9. Avoid feeling anxious or stressed. Find good ways to deal with stress, such as  exercise. 10. Maintain a healthy weight. Extra weight puts stress on your back. GET HELP IF:  1. You have pain that does not go away with rest or medicine. 2. You have worsening pain that goes down into your legs or buttocks. 3. You have pain that does not get better in one week. 4. You have pain at night. 5. You lose weight. 6. You have a fever or chills. GET HELP RIGHT AWAY IF:  1. You cannot control when you poop (bowel movement) or pee (urinate). 2. Your arms or legs feel weak. 3. Your arms or legs lose feeling (numbness). 4. You feel sick to your stomach (nauseous) or throw up (vomit). 5. You have belly (abdominal) pain. 6. You feel like you may pass out (faint).   This information is not intended to replace advice given to you by your health care provider. Make sure you discuss any questions you have with your health care provider.   Document Released: 05/24/2008 Document Revised: 12/27/2014 Document Reviewed: 04/09/2014 Elsevier Interactive Patient Education 2016 [place] Injury Prevention Back injuries can be very painful. They can also be difficult to heal. After having one back injury, you are more likely to injure your back again. It is important to learn how to avoid injuring or re-injuring your back. The following tips can help you to prevent a back injury. WHAT SHOULD I KNOW ABOUT PHYSICAL  FITNESS? 11. Exercise for 30 minutes per day on most days of the week or as told by your doctor. Make sure to: 1. Do aerobic exercises, such as walking, jogging, biking, or swimming. 2. Do exercises that increase balance and strength, such as tai chi and yoga. 3. Do stretching exercises. This helps with flexibility. 4. Try to develop strong belly (abdominal) muscles. Your belly muscles help to support your back. 12. Stay at a healthy weight. This helps to decrease your risk of a back injury. WHAT SHOULD I KNOW ABOUT MY DIET? 7. Talk with your doctor about your overall diet.  Take supplements and vitamins only as told by your doctor. 8. Talk with your doctor about how much calcium and vitamin D you need each day. These nutrients help to prevent weakening of the bones (osteoporosis). 9. Include good sources of calcium in your diet, such as: 1. Dairy products. 2. Green leafy vegetables. 3. Products that have had calcium added to them (fortified). 10. Include good sources of vitamin D in your diet, such as: 1. Milk. 2. Foods that have had vitamin D added to them. WHAT SHOULD I KNOW ABOUT MY POSTURE? 7. Sit up straight and stand up straight. Avoid leaning forward when you sit or hunching over when you stand. 8. Choose chairs that have good low-back (lumbar) support. 9. If you work at a desk, sit close to it so you do not need to lean over. Keep your chin tucked in. Keep your neck drawn back. Keep your elbows bent so your arms look like the letter "L" (right angle). 10. Sit high and close to the steering wheel when you drive. Add a low-back support to your car seat, if needed. 11. Avoid sitting or standing in one position for very long. Take breaks to get up, stretch, and walk around at least one time every hour. Take breaks every hour if you are driving for long periods of time. 12. Sleep on your side with your knees slightly bent, or sleep on your back with a pillow under your knees. Do not lie on the front of your body to sleep. WHAT SHOULD I KNOW ABOUT LIFTING, TWISTING, AND REACHING Lifting and Heavy Lifting 1. Avoid heavy lifting, especially lifting over and over again. If you must do heavy lifting: 1. Stretch before lifting. 2. Work slowly. 3. Rest between lifts. 4. Use a tool such as a cart or a dolly to move objects if one is available. 5. Make several small trips instead of carrying one heavy load. 6. Ask for help when you need it, especially when moving big objects. 2. Follow these steps when lifting: 1. Stand with your feet shoulder-width apart. 2. Get  as close to the object as you can. Do not pick up a heavy object that is far from your body. 3. Use handles or lifting straps if they are available. 4. Bend at your knees. Squat down, but keep your heels off the floor. 5. Keep your shoulders back. Keep your chin tucked in. Keep your back straight. 6. Lift the object slowly while you tighten the muscles in your legs, belly, and butt. Keep the object as close to the center of your body as possible. 3. Follow these steps when putting down a heavy load: 1. Stand with your feet shoulder-width apart. 2. Lower the object slowly while you tighten the muscles in your legs, belly, and butt. Keep the object as close to the center of your body as possible. 3. Keep your  shoulders back. Keep your chin tucked in. Keep your back straight. 4. Bend at your knees. Squat down, but keep your heels off the floor. 5. Use handles or lifting straps if they are available. Twisting and Reaching 1. Avoid lifting heavy objects above your waist. 2. Do not twist at your waist while you are lifting or carrying a load. If you need to turn, move your feet. 3. Do not bend over without bending at your knees. 4. Avoid reaching over your head, across a table, or for an object on a high surface.  WHAT ARE SOME OTHER TIPS? 1. Avoid wet floors and icy ground. Keep sidewalks clear of ice to prevent falls.  2. Do not sleep on a mattress that is too soft or too hard.  3. Keep items that you use often within easy reach.  4. Put heavier objects on shelves at waist level, and put lighter objects on lower or higher shelves. 5. Find ways to lower your stress, such as: 1. Exercise. 2. Massage. 3. Relaxation techniques. 6. Talk with your doctor if you feel anxious or depressed. These conditions can make back pain worse. 7. Wear flat heel shoes with cushioned soles. 8. Avoid making quick (sudden) movements. 9. Use both shoulder straps when carrying a backpack. 10. Do not use any  tobacco products, including cigarettes, chewing tobacco, or electronic cigarettes. If you need help quitting, ask your doctor.   This information is not intended to replace advice given to you by your health care provider. Make sure you discuss any questions you have with your health care provider.   Document Released: 05/24/2008 Document Revised: 04/22/2015 Document Reviewed: 12/10/2014 Elsevier Interactive Patient Education [employer].

## 2016-06-25 ENCOUNTER — Other Ambulatory Visit: Payer: Self-pay | Admitting: Family Medicine

## 2016-07-19 ENCOUNTER — Ambulatory Visit (INDEPENDENT_AMBULATORY_CARE_PROVIDER_SITE_OTHER): Payer: BLUE CROSS/BLUE SHIELD | Admitting: Family Medicine

## 2016-07-19 ENCOUNTER — Encounter: Payer: Self-pay | Admitting: Family Medicine

## 2016-07-19 ENCOUNTER — Encounter: Payer: Self-pay | Admitting: *Deleted

## 2016-07-19 ENCOUNTER — Emergency Department (INDEPENDENT_AMBULATORY_CARE_PROVIDER_SITE_OTHER)
Admission: EM | Admit: 2016-07-19 | Discharge: 2016-07-19 | Disposition: A | Discharge status: Home / Self Care | Payer: BLUE CROSS/BLUE SHIELD | Source: Home / Self Care

## 2016-07-19 VITALS — BP 136/80 | HR 69 | Wt 249.0 lb

## 2016-07-19 DIAGNOSIS — N529 Male erectile dysfunction, unspecified: Secondary | ICD-10-CM | POA: Diagnosis not present

## 2016-07-19 DIAGNOSIS — E119 Type 2 diabetes mellitus without complications: Secondary | ICD-10-CM

## 2016-07-19 DIAGNOSIS — Z1211 Encounter for screening for malignant neoplasm of colon: Secondary | ICD-10-CM

## 2016-07-19 DIAGNOSIS — N521 Erectile dysfunction due to diseases classified elsewhere: Secondary | ICD-10-CM | POA: Diagnosis not present

## 2016-07-19 LAB — POCT GLYCOSYLATED HEMOGLOBIN (HGB A1C): HEMOGLOBIN A1C: 6.1

## 2016-07-19 MED ORDER — DAPAGLIFLOZIN PROPANEDIOL 5 MG PO TABS: 5.0000 mg | ORAL_TABLET | Freq: Every day | ORAL | 5 refills | Status: DC

## 2016-07-19 NOTE — ED Provider Notes (Signed)
[facility]    CSN: [id number] Arrival date & time: 07/19/16  0807  First Provider Contact:  None       History   Chief Complaint Chief Complaint  Patient presents with  . Cough  . Abdominal Pain  . Erectile Dysfunction    HPI Andrew Hanson is a 60 y.o. male.   HPI 60 year old male presents to [facility], Monday morning, 8:30 AM. He has multiple nonacute complaints, including fatigue, erectile dysfunction, decreased libido, vague minimal left lower quadrant discomfort intermittently for 3-4 years. He states he is worried about all these symptoms, including diabetes and his need for colonoscopy.  Currently denies fever or chills or abdominal pain, nausea or vomiting, chest pain, shortness of breath. Denies dysuria, hematuria, penile lesions. He is monogamous with wife. Denies syncope, headache or focal neurologic symptoms.  PCP is Dr. Ivan Anchors, last seen face-to-face by Dr. Ivan Anchors 01/14/2016. Diagnosed with type 2 diabetes then, placed on metformin and lisinopril. Past Medical History:  Diagnosis Date  . Diabetes mellitus without complication (HCC)   . Hyperlipidemia   . Hypertension     Patient Active Problem List   Diagnosis Date Noted  . Type 2 diabetes mellitus (HCC) 01/19/2016  . Hypertriglyceridemia 07/16/2015  . Abdominal wall pain 04/30/2015  . Bilateral renal cysts 04/24/2015  . Chronic renal insufficiency, stage III (moderate) 04/18/2015  . Osteoarthritis 04/18/2015  . Nephrolithiasis 04/18/2015  . Essential hypertension 04/18/2015  . Depression 04/18/2015  . History of hepatitis 04/17/2015  . Left flank pain 04/17/2015  . Arthritis, senescent 04/17/2015  . CN (constipation) 04/17/2015    History reviewed. No pertinent surgical history.     Home Medications    Prior to Admission medications   Medication Sig Start Date End Date Taking? Authorizing Provider  Brexpiprazole 0.5 MG TABS Take by mouth. Starter pack     Historical Provider, MD  budesonide-formoterol (SYMBICORT) 160-4.5 MCG/ACT inhaler Inhale 2 puffs into the lungs 2 (two) times daily. 01/14/16   Laren Boom, DO  celecoxib (CELEBREX) 200 MG capsule Take 1 capsule (200 mg total) by mouth 2 (two) times daily as needed. Joint/Back pain. 04/17/15   Laren Boom, DO  Cholecalciferol (VITAMIN D3) 2000 UNITS TABS Take by mouth 2 (two) times daily.    Historical Provider, MD  Linaclotide Karlene Einstein) 145 MCG CAPS capsule Take 1 capsule (145 mcg total) by mouth daily. For constipation. 04/18/15   Laren Boom, DO  lisdexamfetamine (VYVANSE) 50 MG capsule  08/05/13   Historical Provider, MD  lisinopril (PRINIVIL,ZESTRIL) 20 MG tablet Take 1 tablet (20 mg total) by mouth daily. 01/26/16   Laren Boom, DO  metFORMIN (GLUCOPHAGE) 1000 MG tablet One tablet by mouth every evening for blood sugar control. 01/19/16 01/18/17  Laren Boom, DO  naproxen (NAPROSYN) 500 MG tablet take 1 tablet by mouth twice a day with food if needed for headache 08/29/14   Historical Provider, MD  Omega-3 Fatty Acids (FISH OIL) 1000 MG CAPS One by mouth twice a day with meals. 07/16/15   Laren Boom, DO  omeprazole (PRILOSEC) 20 MG capsule take 1 capsule by mouth twice a day before meals 06/28/16   Sean Hommel, DO  sertraline (ZOLOFT) 100 MG tablet Take 100 mg by mouth.    Historical Provider, MD    Family History Family History  Problem Relation Age of Onset  . Alcoholism    . Depression    . Hyperlipidemia    . Hypertension      Social History  Social History  Substance Use Topics  . Smoking status: Current Every Day Smoker  . Smokeless tobacco: Never Used  . Alcohol use No     Allergies   Statins   Review of Systems Review of Systems See above in history of present illness. He mentions vague nonproductive cough for 2-3 months but no other cardiorespiratory symptoms.  Physical Exam Triage Vital Signs ED Triage Vitals  Enc Vitals Group     BP 07/19/16 0836 129/78     Pulse  Rate 07/19/16 0836 76     Resp 07/19/16 0836 16     Temp 07/19/16 0836 98.2 F (36.8 C)     Temp Source 07/19/16 0836 Oral     SpO2 07/19/16 0836 98 %     Weight 07/19/16 0836 245 lb (111.1 kg)     Height 07/19/16 0836 6' 1.5" (1.867 m)     Head Circumference --      Peak Flow --      Pain Score 07/19/16 0839 0     Pain Loc --      Pain Edu? --      Excl. in GC? --    No data found.   Updated Vital Signs BP 129/78 (BP Location: Left Arm)   Pulse 76   Temp 98.2 F (36.8 C) (Oral)   Resp 16   Ht 6' 1.5" (1.867 m)   Wt 245 lb (111.1 kg)   SpO2 98%   BMI 31.89 kg/m   Visual Acuity Right Eye Distance:   Left Eye Distance:   Bilateral Distance:    Right Eye Near:   Left Eye Near:    Bilateral Near:     Physical Exam  Constitutional: He is oriented to person, place, and time. He appears well-developed and well-nourished. No distress.  HENT:  Head: Normocephalic and atraumatic.  Eyes: Conjunctivae and EOM are normal. Pupils are equal, round, and reactive to light. No scleral icterus.  Neck: Normal range of motion.  Cardiovascular: Normal rate.   Pulmonary/Chest: Effort normal.  Abdominal: He exhibits no distension.  Musculoskeletal: Normal range of motion.  Neurological: He is alert and oriented to person, place, and time.  Skin: Skin is warm.  Psychiatric: He has a normal mood and affect.  Nursing note and vitals reviewed.    UC Treatments / Results  Labs (all labs ordered are listed, but only abnormal results are displayed) Labs Reviewed - No data to display  EKG  EKG Interpretation None       Radiology No results found.  Procedures Procedures (including critical care time)  Medications Ordered in UC Medications - No data to display   Initial Impression / Assessment and Plan / UC Course  I have reviewed the triage vital signs and the nursing notes.  Pertinent labs & imaging results that were available during my care of the patient were  reviewed by me and considered in my medical decision making (see chart for details).  Clinical Course   Over 15 minutes spent, greater than 50% of the time spent for counseling and coordination of care.  I explained to him that he has multiple nonacute problems, that need to be addressed by his PCP. Explained ED and decreased libido have multiple possible causes, which should be evaluated and managed by PCP. He states he is worried about other things, and I advised to address that with his PCP. Explained that he should see PCP to manage all his chronic problems, including type 2  diabetes, hypertension, and hypercholesterolemia as well as managing all his preventive care.   As anticipatory guidance, I explained that all the above cannot be done in one visit, so he should see his PCP and follow up regularly per PCPs recommendations.  We were able to call and make an appointment for patient to see Dr. Ivan Anchors today at 10 AM.    Patient voiced understanding and agreement with above   Final Clinical Impressions(s) / UC Diagnoses   Final diagnoses:  Erectile dysfunction due to diseases classified elsewhere    New Prescriptions Current Discharge Medication List       Lajean Manes, MD 07/19/16 [phone]

## 2016-07-19 NOTE — ED Triage Notes (Signed)
Pt c/o nonproductive cough x 2 mths. Current smoker. He also c/o LLQ abd intermittently x 3-4 years. Pt reports decrease in his sex drive.

## 2016-07-19 NOTE — Progress Notes (Signed)
CC: Andrew Hanson is a 60 y.o. male is here for Hyperglycemia; Erectile Dysfunction; Cough; and Referral   Subjective: HPI:  He is overdue for colonoscopy, he tells me he needs another referral for this.  Follow-up type 2 diabetes: Currently not taking any medication for blood sugar. Metformin caused intolerable diarrhea. He said his blood sugar checked 2 or 3 times since I saw him last and he tells me that the number has always been in the low 100s. Denies vision disturbance, polyuria or polyphagia.  He complains today he's having difficulty with erections. He has difficulty both initiating and maintaining erections. Symptoms have been occurring more frequently over the past 3 months. He still believes that his libido is normal. He still is physically attracted to his wife. Denies any other genitourinary complaints. No chest pain or limb claudication   Review Of Systems Outlined In HPI  Past Medical History:  Diagnosis Date  . Diabetes mellitus without complication (HCC)   . Hyperlipidemia   . Hypertension     No past surgical history on file. Family History  Problem Relation Age of Onset  . Alcoholism    . Depression    . Hyperlipidemia    . Hypertension      Social History   Social History  . Marital status: Married    Spouse name: N/A  . Number of children: N/A  . Years of education: N/A   Occupational History  . Not on file.   Social History Main Topics  . Smoking status: Current Every Day Smoker  . Smokeless tobacco: Never Used  . Alcohol use No  . Drug use: No  . Sexual activity: No   Other Topics Concern  . Not on file   Social History Narrative  . No narrative on file     Objective: BP 136/80   Pulse 69   Wt 249 lb (112.9 kg)   BMI 32.41 kg/m   General: Alert and Oriented, No Acute Distress HEENT: Pupils equal, round, reactive to light. Conjunctivae clear.  Moist membranes Lungs: Clear to auscultation bilaterally, no wheezing/ronchi/rales.   Comfortable work of breathing. Good air movement. Cardiac: Regular rate and rhythm. Normal S1/S2.  No murmurs, rubs, nor gallops.   Extremities: No peripheral edema.  Strong peripheral pulses.  Mental Status: No depression, anxiety, nor agitation. Skin: Warm and dry.  Assessment & Plan: [name] was seen today for hyperglycemia, erectile dysfunction, cough and referral.  Diagnoses and all orders for this visit:  Special screening for malignant neoplasms, colon -     Ambulatory referral to Gastroenterology  Type 2 diabetes mellitus without complication, without long-term current use of insulin (HCC) -     Testosterone  Erectile dysfunction, unspecified erectile dysfunction type  Other orders -     dapagliflozin propanediol (FARXIGA) 5 MG TABS tablet; Take 5 mg by mouth daily.   Type 2 diabetes: A1c of 6.1, controlled, I discussed that he does not necessarily have to be on medication for blood sugar control however he was interested in [place] since it might help her lose some weight log keeping blood sugar under control. Erectile dysfunction: Rule out hypogonadism prior to considering Viagra or Cialis   Return in about 3 months (around 10/19/2016) for Dr. Denyse Amass Visit.  Discussed with this patient that I will be resigning from my position here with [facility] in September in order to stay with my family who will be moving to [facility]. I let him know about the providers  that are still accepting patients and I feel that this individual will be under great care if he/she stays here with [facility].

## 2016-07-20 ENCOUNTER — Telehealth: Payer: Self-pay | Admitting: Family Medicine

## 2016-07-20 LAB — TESTOSTERONE: TESTOSTERONE: 347 ng/dL (ref 250–827)

## 2016-07-20 MED ORDER — SILDENAFIL CITRATE 100 MG PO TABS: 50.0000 mg | ORAL_TABLET | Freq: Every day | ORAL | 11 refills | Status: DC | PRN

## 2016-07-20 NOTE — Telephone Encounter (Signed)
Pt notified.    He is asking will he have to take Viagra every time before sexual intercourse?  Or is there other ways for him to get an erection with out taking medication.

## 2016-07-20 NOTE — Telephone Encounter (Signed)
Pt.notified

## 2016-07-20 NOTE — Telephone Encounter (Signed)
For the next few weeks he'll need to take it before intercourse, after a few weeks many men find that they no longer require it.

## 2016-07-20 NOTE — Telephone Encounter (Signed)
We please let patient know that his testosterone level was in the normal range. I sent a Viagra perception to his pharmacy in case he wanted to try to use this for erectile dysfunction. We have savings vouchers here at the office if he would like to pick one up before he goes to the pharmacy.

## 2016-07-22 ENCOUNTER — Telehealth: Payer: Self-pay | Admitting: *Deleted

## 2016-07-22 NOTE — Telephone Encounter (Signed)
Approvedon August 1  Effective from 07/20/2016 through 12/19/2038.  patient notified and pharm notified

## 2016-07-26 ENCOUNTER — Telehealth: Payer: Self-pay | Admitting: Family Medicine

## 2016-07-26 NOTE — Telephone Encounter (Signed)
Sue LushAndrea, I put a letter from express prescription senior in box, I believe that Lorella NimrodHarvey qualifies for a prior authorization since he is already tried metformin which is one of the step one medications the paper is referring to.  If this goes through and the patient still needs to pay to get Marcelline DeistFarxiga we please note that he should be getting this for free and we let Rosiland Ozhris Cox know about this who is a Designer, television/film setpharmaceutical rep for this drug.

## 2016-07-27 NOTE — Telephone Encounter (Signed)
Called the farxiga hotline and initiated a PA through them. They will handle the PA and only call us back if there is a problem.

## 2016-08-09 ENCOUNTER — Emergency Department (INDEPENDENT_AMBULATORY_CARE_PROVIDER_SITE_OTHER)
Admission: EM | Admit: 2016-08-09 | Discharge: 2016-08-09 | Disposition: A | Discharge status: Home / Self Care | Payer: BLUE CROSS/BLUE SHIELD | Source: Home / Self Care | Attending: Family Medicine | Admitting: Family Medicine

## 2016-08-09 ENCOUNTER — Encounter: Payer: Self-pay | Admitting: Emergency Medicine

## 2016-08-09 ENCOUNTER — Other Ambulatory Visit: Payer: Self-pay | Admitting: Family Medicine

## 2016-08-09 DIAGNOSIS — R6882 Decreased libido: Secondary | ICD-10-CM

## 2016-08-09 DIAGNOSIS — Z711 Person with feared health complaint in whom no diagnosis is made: Secondary | ICD-10-CM | POA: Diagnosis not present

## 2016-08-09 DIAGNOSIS — R109 Unspecified abdominal pain: Secondary | ICD-10-CM

## 2016-08-09 LAB — POCT URINALYSIS DIP (MANUAL ENTRY)
Bilirubin, UA: NEGATIVE
Glucose, UA: 500 — AB
Ketones, POC UA: NEGATIVE
Leukocytes, UA: NEGATIVE
Nitrite, UA: NEGATIVE
Protein Ur, POC: NEGATIVE
Spec Grav, UA: 1.015 (ref 1.005–1.03)
Urobilinogen, UA: 0.2 (ref 0–1)
pH, UA: 5 (ref 5–8)

## 2016-08-09 LAB — HIV ANTIBODY (ROUTINE TESTING W REFLEX): HIV 1&2 Ab, 4th Generation: NONREACTIVE

## 2016-08-09 MED ORDER — AZITHROMYCIN 500 MG PO TABS: 1000.0000 mg | ORAL_TABLET | Freq: Once | ORAL | 0 refills | Status: AC

## 2016-08-09 MED ORDER — CEFTRIAXONE SODIUM 250 MG IJ SOLR
250.0000 mg | Freq: Once | INTRAMUSCULAR | Status: AC
  Administered 2016-08-09: 250 mg via INTRAMUSCULAR

## 2016-08-09 NOTE — ED Provider Notes (Signed)
CSN: [id number]     Arrival date & time 08/09/16  [id number] History   First MD Initiated Contact with Patient 08/09/16 1001     Chief Complaint  Patient presents with  . Groin Pain   (Consider location/radiation/quality/duration/timing/severity/associated sxs/prior Treatment) HPI  Neva SeatHarvey Klingbeil is a 60 y.o. male presenting to UC with concern for penile tingling, lower abdominal cramping, and loss of sex drive. Pt is concerned he may have an STD.  He notes he is in a monogamous marriage but still wonders if he may have an infection.  He has not been tested or treated for STDs yet but has been seen by Dr. Ivan AnchorsHommel, his PCP for erectile dysfunction, currently in process of evaluation and treatment through his PCP.  He has not mentioned concern for STD to his PCP.  Denies penile discharge, rashes or lesions. Denies known exposure.      Past Medical History:  Diagnosis Date  . Diabetes mellitus without complication (HCC)   . Hyperlipidemia   . Hypertension    History reviewed. No pertinent surgical history. Family History  Problem Relation Age of Onset  . Alcoholism    . Depression    . Hyperlipidemia    . Hypertension     Social History  Substance Use Topics  . Smoking status: Current Every Day Smoker  . Smokeless tobacco: Never Used  . Alcohol use No    Review of Systems  Constitutional: Negative for appetite change, chills, fatigue and fever.  Gastrointestinal: Positive for abdominal pain. Negative for diarrhea, nausea and vomiting.  Genitourinary: Positive for dysuria ( tingling) and penile pain ("tingling"). Negative for decreased urine volume, difficulty urinating, discharge, flank pain, frequency, genital sores, hematuria, penile swelling, scrotal swelling, testicular pain and urgency.  Musculoskeletal: Negative for back pain and myalgias.  Skin: Negative for color change and rash.    Allergies  Metformin and related and Statins  Home Medications   Prior to Admission  medications   Medication Sig Start Date End Date Taking? Authorizing Provider  azithromycin (ZITHROMAX) 500 MG tablet Take 2 tablets (1,000 mg total) by mouth once. May take over 1 hour to help reduce stomach upset. 08/09/16 08/09/16  Junius FinnerErin O'Malley, PA-C  celecoxib (CELEBREX) 200 MG capsule Take 1 capsule (200 mg total) by mouth 2 (two) times daily as needed. Joint/Back pain. 04/17/15   Laren BoomSean Hommel, DO  Cholecalciferol (VITAMIN D3) 2000 UNITS TABS Take by mouth 2 (two) times daily.    Historical Provider, MD  dapagliflozin propanediol (FARXIGA) 5 MG TABS tablet Take 5 mg by mouth daily. 07/19/16   Laren BoomSean Hommel, DO  Linaclotide (LINZESS) 145 MCG CAPS capsule Take 1 capsule (145 mcg total) by mouth daily. For constipation. 04/18/15   Laren BoomSean Hommel, DO  lisdexamfetamine (VYVANSE) 50 MG capsule  08/05/13   Historical Provider, MD  lisinopril (PRINIVIL,ZESTRIL) 20 MG tablet take 1 tablet by mouth once daily 08/09/16   Laren BoomSean Hommel, DO  naproxen (NAPROSYN) 500 MG tablet take 1 tablet by mouth twice a day with food if needed for headache 08/29/14   Historical Provider, MD  Omega-3 Fatty Acids (FISH OIL) 1000 MG CAPS One by mouth twice a day with meals. 07/16/15   Laren BoomSean Hommel, DO  omeprazole (PRILOSEC) 20 MG capsule take 1 capsule by mouth twice a day before meals 06/28/16   Sean Hommel, DO  sertraline (ZOLOFT) 100 MG tablet Take 100 mg by mouth.    Historical Provider, MD  sildenafil (VIAGRA) 100 MG tablet Take 0.5-1 tablets (50-100  mg total) by mouth daily as needed for erectile dysfunction. 07/20/16   Laren BoomSean Hommel, DO   Meds Ordered and Administered this Visit   Medications  cefTRIAXone (ROCEPHIN) injection 250 mg (250 mg Intramuscular Given 08/09/16 1052)    BP 143/73 (BP Location: Left Arm)   Pulse 69   Temp 98.3 F (36.8 C) (Oral)   Ht 5\' 11"  (1.803 m)   Wt 252 lb (114.3 kg)   SpO2 97%   BMI 35.15 kg/m  No data found.   Physical Exam  Constitutional: He is oriented to person, place, and time. He  appears well-developed and well-nourished.  Pt sitting comfortably on exam bed, NAD  HENT:  Head: Normocephalic and atraumatic.  Cardiovascular: Normal rate and regular rhythm.   Pulmonary/Chest: Effort normal. No respiratory distress.  Abdominal: Soft. He exhibits no distension. There is no tenderness.  Musculoskeletal: Normal range of motion.  Neurological: He is alert and oriented to person, place, and time.  Skin: Skin is warm and dry.  Psychiatric: He has a normal mood and affect. His behavior is normal.  Nursing note and vitals reviewed.   Urgent Care Course   Clinical Course    Procedures (including critical care time)  Labs Review Labs Reviewed  POCT URINALYSIS DIP (MANUAL ENTRY) - Abnormal; Notable for the following:       Result Value   Glucose, UA =500 (*)    Blood, UA trace-lysed (*)    All other components within normal limits  GC/CHLAMYDIA PROBE AMP  RPR  HIV ANTIBODY (ROUTINE TESTING)    Imaging Review No results found.   MDM   1. Concern about STD in male without diagnosis   2. Decreased sex drive    Pt medical records reviewed. When asked about f/u with his PCP, pt noted he is really here for concern for STD rather than his decreased sex drive.  Discussed waiting to treat for gonorrhea/chlamydia until tests come back, pt insisted on being empirically treated as he is very concerned despite known exposure, discharge or lesions.  UA: unremarkable, no evidence of infection.   Rocephin IM given in UC, prescription for Azithromycin provided. Labs pending:GC/Chlamydia, syphilis,s and HIV Encouraged to f/u with PCP for ongoing healthcare needs. Advised to have his wife and all other potential partners tested/treated if his results come back positive. Patient verbalized understanding and agreement with treatment plan.    Junius Finnerrin O'Malley, PA-C 08/09/16 1129

## 2016-08-09 NOTE — Discharge Instructions (Signed)
°  If you are unsure about having an STD, please wait until your test results come back in a few days.  If you do tets positive for gonorrhea or chlamydia, please refrain from sexual intercourse for 7 days. Be sure to have all partners tested and treated for STDs.  This may be performed by your primary care provider or the health department.  Practice safe sex by always using condoms.

## 2016-08-09 NOTE — ED Triage Notes (Signed)
Penis tingling, lower abdominal pain, loss of sex drive, wants blood work, denies new sexual partners

## 2016-08-10 ENCOUNTER — Telehealth: Payer: Self-pay | Admitting: *Deleted

## 2016-08-10 ENCOUNTER — Emergency Department (INDEPENDENT_AMBULATORY_CARE_PROVIDER_SITE_OTHER)
Admission: EM | Admit: 2016-08-10 | Discharge: 2016-08-10 | Disposition: A | Discharge status: Home / Self Care | Payer: BLUE CROSS/BLUE SHIELD | Source: Home / Self Care

## 2016-08-10 ENCOUNTER — Encounter: Payer: Self-pay | Admitting: *Deleted

## 2016-08-10 DIAGNOSIS — R109 Unspecified abdominal pain: Secondary | ICD-10-CM | POA: Diagnosis not present

## 2016-08-10 LAB — GC/CHLAMYDIA PROBE AMP
CT Probe RNA: NOT DETECTED
GC Probe RNA: NOT DETECTED

## 2016-08-10 LAB — RPR TITER: RPR Titer: 1:1 {titer}

## 2016-08-10 LAB — RPR: RPR Ser Ql: REACTIVE — AB

## 2016-08-10 LAB — FLUORESCENT TREPONEMAL AB(FTA)-IGG-BLD: Fluorescent Treponemal ABS: NONREACTIVE

## 2016-08-10 MED ORDER — PENICILLIN G BENZATHINE 1200000 UNIT/2ML IM SUSP
2.4000 10*6.[IU] | Freq: Once | INTRAMUSCULAR | Status: AC
  Administered 2016-08-10: 2.4 10*6.[IU] via INTRAMUSCULAR

## 2016-08-10 NOTE — ED Triage Notes (Signed)
Pt is here for treatment for reactive RPR performed yesterday.

## 2016-08-11 ENCOUNTER — Telehealth: Payer: Self-pay | Admitting: Emergency Medicine

## 2016-08-11 NOTE — Telephone Encounter (Signed)
GC is neg

## 2016-08-12 ENCOUNTER — Telehealth (HOSPITAL_BASED_OUTPATIENT_CLINIC_OR_DEPARTMENT_OTHER): Payer: Self-pay | Admitting: Emergency Medicine

## 2016-08-18 ENCOUNTER — Encounter: Payer: Self-pay | Admitting: Family Medicine

## 2016-08-18 ENCOUNTER — Ambulatory Visit (INDEPENDENT_AMBULATORY_CARE_PROVIDER_SITE_OTHER): Payer: BLUE CROSS/BLUE SHIELD | Admitting: Family Medicine

## 2016-08-18 DIAGNOSIS — R768 Other specified abnormal immunological findings in serum: Secondary | ICD-10-CM

## 2016-08-18 NOTE — Progress Notes (Signed)
CC: Andrew Hanson is a 60 y.o. male is here for Follow-up   Subjective: HPI:  He recently had a positive RPR at urgent care and he is unaware that the treponemal test was nonreactive. He's never had any penile discharge or suspicious skin lesions. He received penicillin when the RPR was found to be positive. He has some tingling when he urinates ever since starting on farxiga but denies pain with urinating. He is in a monogamous relationship and his wife swears that she's not been sexually active with anybody other than him since marriage.   Review Of Systems Outlined In HPI  Past Medical History:  Diagnosis Date  . Diabetes mellitus without complication (HCC)   . Hyperlipidemia   . Hypertension     No past surgical history on file. Family History  Problem Relation Age of Onset  . Alcoholism    . Depression    . Hyperlipidemia    . Hypertension      Social History   Social History  . Marital status: Married    Spouse name: N/A  . Number of children: N/A  . Years of education: N/A   Occupational History  . Not on file.   Social History Main Topics  . Smoking status: Current Every Day Smoker  . Smokeless tobacco: Never Used  . Alcohol use No  . Drug use: No  . Sexual activity: No   Other Topics Concern  . Not on file   Social History Narrative  . No narrative on file     Objective: BP 133/83   Pulse 85   Wt 250 lb (113.4 kg)   BMI 34.87 kg/m   Vital signs reviewed. General: Alert and Oriented, No Acute Distress HEENT: Pupils equal, round, reactive to light. Conjunctivae clear.  External ears unremarkable.  Moist mucous membranes. Lungs: Clear and comfortable work of breathing, speaking in full sentences without accessory muscle use. Cardiac: Regular rate and rhythm.  Neuro: CN II-XII grossly intact, gait normal. Extremities: No peripheral edema.  Strong peripheral pulses.  Mental Status: No depression, anxiety, nor agitation. Logical though  process. Skin: Warm and dry.  Assessment & Plan: Andrew Hanson was seen today for follow-up.  Diagnoses and all orders for this visit:  Biological false positive RPR test   I let him know it is very unlikely that he had syphilis and that his RPR test was probably positive due to him being part of the 1-2% of the population the tests positive for RPRs or due to his remote history of hepatitis he could be now test positive with the RPR test. I let him know that it safe for him to let his wife know about this being a false positive.  He's having trouble getting his old colonoscopy records from his former [facility] practice.  I'm having him fill out a records request today.  Once received by our office, I request with this office note that his future provider forward this new info to [facility] so they will schedule his colonoscopy.  Return if symptoms worsen or fail to improve.

## 2016-10-19 ENCOUNTER — Ambulatory Visit: Payer: BLUE CROSS/BLUE SHIELD | Admitting: Family Medicine

## 2016-10-21 ENCOUNTER — Ambulatory Visit (INDEPENDENT_AMBULATORY_CARE_PROVIDER_SITE_OTHER): Payer: BLUE CROSS/BLUE SHIELD

## 2016-10-21 ENCOUNTER — Ambulatory Visit (INDEPENDENT_AMBULATORY_CARE_PROVIDER_SITE_OTHER): Payer: BLUE CROSS/BLUE SHIELD | Admitting: Family Medicine

## 2016-10-21 VITALS — BP 132/80 | HR 73 | Wt 256.0 lb

## 2016-10-21 DIAGNOSIS — N183 Chronic kidney disease, stage 3 unspecified: Secondary | ICD-10-CM

## 2016-10-21 DIAGNOSIS — R079 Chest pain, unspecified: Secondary | ICD-10-CM | POA: Diagnosis not present

## 2016-10-21 DIAGNOSIS — E119 Type 2 diabetes mellitus without complications: Secondary | ICD-10-CM | POA: Diagnosis not present

## 2016-10-21 DIAGNOSIS — E781 Pure hyperglyceridemia: Secondary | ICD-10-CM

## 2016-10-21 DIAGNOSIS — R05 Cough: Secondary | ICD-10-CM

## 2016-10-21 DIAGNOSIS — I10 Essential (primary) hypertension: Secondary | ICD-10-CM

## 2016-10-21 DIAGNOSIS — R7303 Prediabetes: Secondary | ICD-10-CM

## 2016-10-21 LAB — CBC
HEMATOCRIT: 43.1 % (ref 38.5–50.0)
HEMOGLOBIN: 14.7 g/dL (ref 13.2–17.1)
MCH: 29.6 pg (ref 27.0–33.0)
MCHC: 34.1 g/dL (ref 32.0–36.0)
MCV: 86.9 fL (ref 80.0–100.0)
MPV: 12.8 fL — ABNORMAL HIGH (ref 7.5–12.5)
Platelets: 202 10*3/uL (ref 140–400)
RBC: 4.96 MIL/uL (ref 4.20–5.80)
RDW: 13.7 % (ref 11.0–15.0)
WBC: 3.9 10*3/uL (ref 3.8–10.8)

## 2016-10-21 LAB — POCT GLYCOSYLATED HEMOGLOBIN (HGB A1C): HEMOGLOBIN A1C: 6.2

## 2016-10-21 MED ORDER — OMEPRAZOLE 20 MG PO CPDR: 20.0000 mg | DELAYED_RELEASE_CAPSULE | Freq: Two times a day (BID) | ORAL | 3 refills | Status: DC

## 2016-10-21 NOTE — Progress Notes (Addendum)
Neva SeatHarvey Neuberger is a 60 y.o. male who [facility] today for establish care. Patient was previously seen by Dr. Ivan AnchorsHommel at this practice. He has since left. [facility].   He would like to address diabetes hypertension hyperlipidemia as well as chest pain and right lateral hip pain.  Diabetes: Patient has diet controlled diabetes. He does not take any medications. In the past used use metformin and [place]xiga. He had trouble tolerating both due to erectile dysfunction. He denies any polyuria or polydipsia. He denies any significant foot numbness pain or weakness.  Hypertension: Well managed with lisinopril. No chest pain palpitations shortness of breath lightheadedness or dizziness. He feels well with this medication.  Acid reflux: Will manage with omeprazole. Patient would like a refill.  Chest pain: Patient notes one and a half month history of left lateral chest pain. The pain does not radiate and is not exertional. However he notes that he essentially never exerts himself. He denies shortness of breath palpitations. She does note the pain is somewhat worse with arm motion. His associated risk factors include obesity age male gender smoking and diabetes  Right lateral hip pain: Patient notes a 2-3 month history of pain in the lateral right hip. Pain is worse with activity and laying on the right side and better with rest. He denies any radiating pain weakness or numbness. He has not tried any treatment yet.   Past Medical History:  Diagnosis Date  . Diabetes mellitus without complication (HCC)   . Hyperlipidemia   . Hypertension    No past surgical history on file. Social History  Substance Use Topics  . Smoking status: Current Every Day Smoker  . Smokeless tobacco: Never Used  . Alcohol use No   family history is not on  file.  ROS as above:  Medications: Current Outpatient Prescriptions  Medication Sig Dispense Refill  . Cholecalciferol (VITAMIN D3) 2000 UNITS TABS Take by mouth 2 (two) times daily.    . dapagliflozin propanediol (FARXIGA) 5 MG TABS tablet Take 5 mg by mouth daily. 30 tablet 5  . lisinopril (PRINIVIL,ZESTRIL) 20 MG tablet take 1 tablet by mouth once daily 90 tablet 0  . omeprazole (PRILOSEC) 20 MG capsule Take 1 capsule (20 mg total) by mouth 2 (two) times daily before a meal. 180 capsule 3   No current facility-administered medications for this visit.    Allergies  Allergen Reactions  . Metformin And Related     diarrhea  . Statins     cramping    Health Maintenance Health Maintenance  Topic Date Due  . PNEUMOCOCCAL POLYSACCHARIDE VACCINE (1) 10/05/1958  . OPHTHALMOLOGY EXAM  10/05/1966  . TETANUS/TDAP  10/06/1975  . ZOSTAVAX  10/05/2016  . INFLUENZA VACCINE  10/21/2017 (Originally 07/20/2016)  . HEMOGLOBIN A1C  01/19/2017  . FOOT EXAM  10/21/2017  . COLONOSCOPY  07/03/2022  . Hepatitis C Screening  Completed  . HIV Screening  Completed     Exam:  BP 132/80   Pulse 73   Wt 256 lb (116.1 kg)   BMI 35.70 kg/m  Gen: Well NAD Nontoxic appearing HEENT: EOMI,  MMM Lungs: Normal work of breathing. CTABL Heart: RRR no MRG Chest wall is nontender Abd: NABS, Soft. Nondistended, Nontender Exts: Brisk capillary refill, warm and well perfused.  Right hip: Normal-appearing. Normal motion without pain. Tender to palpation right greater trochanter. Hip abduction strength  is weak 4+5. Normal gait.   Twelve-lead EKG shows normal sinus rhythm at 64 bpm. Left axis deviation. No ST segment elevation or depression. No Q waves present.   Results for orders placed or performed in visit on 10/21/16 (from the past 72 hour(s))  POCT HgB A1C     Status: None   Collection Time: 10/21/16 10:51 AM  Result Value Ref Range   Hemoglobin A1C 6.2    No results found.    Assessment  and Plan: 60 y.o. male with   Chest pain: New problem today. Patient has elevated risk factors due to his associated medical conditions and smoking status. EKG was largely unremarkable. Chest x-ray is pending. Based on chest x-ray results as well as Framingham risk factor calculation with fasting lipids below will likely refer to cardiology. Certainly I recommend stress testing however his risk may be great enough that he should proceed directly to cardiac catheterization. I'll defer this decision to cardiology expertise.  Diabetes: Will manage. Diet alone patient has transitioned to prediabetes. We'll correct the medical record and continue decrease carbohydrate diet.  Hypertension: Reasonably well controlled. Continue lisinopril. Obtain labs listed below.  Right lateral hip pain: Greater trochanteric her situs versus tendinitis. Plan to treat with home exercise program. Recheck in a month or 2 if not better.   Orders Placed This Encounter  Procedures  . DG Chest 2 View    Order Specific Question:   Reason for exam:    Answer:   left chest pain    Order Specific Question:   Preferred imaging location?    [facility]  . CBC  . COMPLETE METABOLIC PANEL WITH GFR  . Lipid panel  . POCT HgB A1C  . EKG 12-Lead    Discussed warning signs or symptoms. Please see discharge instructions. Patient expresses understanding.   Cholesterol was elevated with a 10 year risk of heart attack or stroke of 26.4%. Patient notes in the past he was unable to tolerate atorvastatin rosuvastatin or simvastatin due to muscle pains or aches.  We'll try Livalo

## 2016-10-21 NOTE — Patient Instructions (Signed)
Thank you for coming in today. Get labs and xray.  I will likely order a cardiology referral.  Return for recheck in 1-2 months.  Return sooner if needed.    Trochanteric Bursitis You have hip pain due to trochanteric bursitis. Bursitis means that the sack near the outside of the hip is filled with fluid and inflamed. This sack is made up of protective soft tissue. The pain from trochanteric bursitis can be severe and keep you from sleep. It can radiate to the buttocks or down the outside of the thigh to the knee. The pain is almost always worse when rising from the seated or lying position and with walking. Pain can improve after you take a few steps. It happens more often in people with hip joint and lumbar spine problems, such as arthritis or previous surgery. Very rarely the trochanteric bursa can become infected, and antibiotics and/or surgery may be needed. Treatment often includes an injection of local anesthetic mixed with cortisone medicine. This medicine is injected into the area where it is most tender over the hip. Repeat injections may be necessary if the response to treatment is slow. You can apply ice packs over the tender area for 30 minutes every 2 hours for the next few days. Anti-inflammatory and/or narcotic pain medicine may also be helpful. Limit your activity for the next few days if the pain continues. See your caregiver in 5-10 days if you are not greatly improved.  SEEK IMMEDIATE MEDICAL CARE IF:  You develop severe pain, fever, or increased redness.  You have pain that radiates below the knee. EXERCISES STRETCHING EXERCISES - Trochanteric Bursitis  These exercises may help you when beginning to rehabilitate your injury. Your symptoms may resolve with or without further involvement from your physician, physical therapist, or athletic trainer. While completing these exercises, remember:   Restoring tissue flexibility helps normal motion to return to the joints. This allows  healthier, less painful movement and activity.  An effective stretch should be held for at least 30 seconds.  A stretch should never be painful. You should only feel a gentle lengthening or release in the stretched tissue. STRETCH - Iliotibial Band  On the floor or bed, lie on your side so your injured leg is on top. Bend your knee and grab your ankle.  Slowly bring your knee back so that your thigh is in line with your trunk. Keep your heel at your buttocks and gently arch your back so your head, shoulders and hips line up.  Slowly lower your leg so that your knee approaches the floor/bed until you feel a gentle stretch on the outside of your thigh. If you do not feel a stretch and your knee will not fall farther, place the heel of your opposite foot on top of your knee and pull your thigh down farther.  Hold this stretch for __________ seconds.  Repeat __________ times. Complete this exercise __________ times per day. STRETCH - Hamstrings, Supine   Lie on your back. Loop a belt or towel over the ball of your foot as shown.  Straighten your knee and slowly pull on the belt to raise your injured leg. Do not allow the knee to bend. Keep your opposite leg flat on the floor.  Raise the leg until you feel a gentle stretch behind your knee or thigh. Hold this position for __________ seconds.  Repeat __________ times. Complete this stretch __________ times per day. STRETCH - Quadriceps, Prone   Lie on your stomach  on a firm surface, such as a bed or padded floor.  Bend your knee and grasp your ankle. If you are unable to reach your ankle or pant leg, use a belt around your foot to lengthen your reach.  Gently pull your heel toward your buttocks. Your knee should not slide out to the side. You should feel a stretch in the front of your thigh and/or knee.  Hold this position for __________ seconds.  Repeat __________ times. Complete this stretch __________ times per day. STRETCHING - Hip  Flexors, Lunge Half kneel with your knee on the floor and your opposite knee bent and directly over your ankle.  Keep good posture with your head over your shoulders. Tighten your buttocks to point your tailbone downward; this will prevent your back from arching too much.  You should feel a gentle stretch in the front of your thigh and/or hip. If you do not feel any resistance, slightly slide your opposite foot forward and then slowly lunge forward so your knee once again lines up over your ankle. Be sure your tailbone remains pointed downward.  Hold this stretch for __________ seconds.  Repeat __________ times. Complete this stretch __________ times per day. STRETCH - Adductors, Lunge  While standing, spread your legs.  Lean away from your injured leg by bending your opposite knee. You may rest your hands on your thigh for balance.  You should feel a stretch in your inner thigh. Hold for __________ seconds.  Repeat __________ times. Complete this exercise __________ times per day.   This information is not intended to replace advice given to you by your health care provider. Make sure you discuss any questions you have with your health care provider.   Document Released: 01/13/2005 Document Revised: 04/22/2015 Document Reviewed: 03/20/2009 Elsevier Interactive Patient Education [employer] Elsevier Inc.

## 2016-10-22 LAB — LIPID PANEL
CHOLESTEROL: 171 mg/dL (ref 125–200)
HDL: 26 mg/dL — AB (ref >=40)
LDL Cholesterol: 95 mg/dL (ref <130)
Total CHOL/HDL Ratio: 6.6 Ratio — ABNORMAL HIGH (ref <=5.0)
Triglycerides: 250 mg/dL — ABNORMAL HIGH (ref <150)
VLDL: 50 mg/dL — ABNORMAL HIGH (ref <30)

## 2016-10-22 LAB — COMPLETE METABOLIC PANEL WITH GFR
ALBUMIN: 4.3 g/dL (ref 3.6–5.1)
ALK PHOS: 46 U/L (ref 40–115)
ALT: 26 U/L (ref 9–46)
AST: 19 U/L (ref 10–35)
BILIRUBIN TOTAL: 0.6 mg/dL (ref 0.2–1.2)
BUN: 13 mg/dL (ref 7–25)
CALCIUM: 9.6 mg/dL (ref 8.6–10.3)
CO2: 27 mmol/L (ref 20–31)
Chloride: 104 mmol/L (ref 98–110)
Creat: 1.48 mg/dL — ABNORMAL HIGH (ref 0.70–1.25)
GFR, EST NON AFRICAN AMERICAN: 51 mL/min — AB (ref >=60)
GFR, Est African American: 59 mL/min — ABNORMAL LOW (ref >=60)
GLUCOSE: 87 mg/dL (ref 65–99)
Potassium: 4.3 mmol/L (ref 3.5–5.3)
SODIUM: 139 mmol/L (ref 135–146)
TOTAL PROTEIN: 6.9 g/dL (ref 6.1–8.1)

## 2016-10-22 MED ORDER — PITAVASTATIN CALCIUM 4 MG PO TABS: 1.0000 | ORAL_TABLET | Freq: Every day | ORAL | 0 refills | Status: DC

## 2016-10-22 NOTE — Addendum Note (Signed)
Addended by: Rodolph BongOREY, Aysen Shieh S on: 10/22/2016 07:19 AM   Modules accepted: Orders

## 2016-10-22 NOTE — Addendum Note (Signed)
Addended by: Rodolph BongOREY, Taniya Dasher S on: 10/22/2016 07:22 AM   Modules accepted: Orders

## 2016-10-25 ENCOUNTER — Telehealth: Payer: Self-pay | Admitting: *Deleted

## 2016-10-25 NOTE — Telephone Encounter (Signed)
PA approved for Livalo 4 mg tablet [id number] - PA Case ID: [id number]  Outcome  Approvedtoday  MWNUUV:[id number];HKVQQVZCaseId:[id number];Product Name:ST: HMG (enhanced - STEP duplicate) GF - (Crestor, Flolipid, Lescol, Lescol XL, Mevacor, Altoprev, Pravachol, Zocor, Lipitor, Caduet, Vytorin, Livalo) ESI;Status:Approved;Coverage Start Date:09/25/2016;Coverage End Date:10/25/2017;  Pharm and patient notified

## 2016-11-22 NOTE — Progress Notes (Deleted)
     HPI: 60 year old male for evaluation of chest pain. MRA May 2016 showed no abd aortic aneurysm. Chest x-ray November 2017 negative.  Current Outpatient Prescriptions  Medication Sig Dispense Refill  . Cholecalciferol (VITAMIN D3) 2000 UNITS TABS Take by mouth 2 (two) times daily.    . dapagliflozin propanediol (FARXIGA) 5 MG TABS tablet Take 5 mg by mouth daily. 30 tablet 5  . lisinopril (PRINIVIL,ZESTRIL) 20 MG tablet take 1 tablet by mouth once daily 90 tablet 0  . omeprazole (PRILOSEC) 20 MG capsule Take 1 capsule (20 mg total) by mouth 2 (two) times daily before a meal. 180 capsule 3  . Pitavastatin Calcium 4 MG TABS Take 1 tablet (4 mg total) by mouth daily. 90 tablet 0   No current facility-administered medications for this visit.     Allergies  Allergen Reactions  . Metformin And Related     diarrhea  . Statins     cramping    Past Medical History:  Diagnosis Date  . Diabetes mellitus without complication (HCC)   . Hyperlipidemia   . Hypertension     No past surgical history on file.  Social History   Social History  . Marital status: Married    Spouse name: N/A  . Number of children: N/A  . Years of education: N/A   Occupational History  . Not on file.   Social History Main Topics  . Smoking status: Current Every Day Smoker  . Smokeless tobacco: Never Used  . Alcohol use No  . Drug use: No  . Sexual activity: No   Other Topics Concern  . Not on file   Social History Narrative  . No narrative on file    Family History  Problem Relation Age of Onset  . Alcoholism    . Depression    . Hyperlipidemia    . Hypertension      ROS: no fevers or chills, productive cough, hemoptysis, dysphasia, odynophagia, melena, hematochezia, dysuria, hematuria, rash, seizure activity, orthopnea, PND, pedal edema, claudication. Remaining systems are negative.  Physical Exam:   There were no vitals taken for this visit.  General:  Well developed/well  nourished in NAD Skin warm/dry Patient not depressed No peripheral clubbing Back-normal HEENT-normal/normal eyelids Neck supple/normal carotid upstroke bilaterally; no bruits; no JVD; no thyromegaly chest - CTA/ normal expansion CV - RRR/normal S1 and S2; no murmurs, rubs or gallops;  PMI nondisplaced Abdomen -NT/ND, no HSM, no mass, + bowel sounds, no bruit 2+ femoral pulses, no bruits Ext-no edema, chords, 2+ DP Neuro-grossly nonfocal  ECG 10/21/2016-sinus rhythm with no ST changes.  A/P  1  Olga MillersBrian Akshita Italiano, MD

## 2016-11-24 ENCOUNTER — Ambulatory Visit: Payer: BLUE CROSS/BLUE SHIELD | Admitting: Cardiology

## 2016-11-26 ENCOUNTER — Other Ambulatory Visit: Payer: Self-pay | Admitting: *Deleted

## 2016-11-26 MED ORDER — LISINOPRIL 20 MG PO TABS: 20.0000 mg | ORAL_TABLET | Freq: Every day | ORAL | 0 refills | Status: DC

## 2016-12-14 ENCOUNTER — Emergency Department (INDEPENDENT_AMBULATORY_CARE_PROVIDER_SITE_OTHER)
Admission: EM | Admit: 2016-12-14 | Discharge: 2016-12-14 | Disposition: A | Discharge status: Home / Self Care | Payer: BLUE CROSS/BLUE SHIELD | Source: Home / Self Care | Attending: Family Medicine | Admitting: Family Medicine

## 2016-12-14 ENCOUNTER — Encounter: Payer: Self-pay | Admitting: Emergency Medicine

## 2016-12-14 DIAGNOSIS — R053 Chronic cough: Secondary | ICD-10-CM

## 2016-12-14 DIAGNOSIS — J209 Acute bronchitis, unspecified: Secondary | ICD-10-CM

## 2016-12-14 DIAGNOSIS — R05 Cough: Secondary | ICD-10-CM

## 2016-12-14 MED ORDER — ALBUTEROL SULFATE HFA 108 (90 BASE) MCG/ACT IN AERS: 1.0000 | INHALATION_SPRAY | Freq: Four times a day (QID) | RESPIRATORY_TRACT | 0 refills | Status: AC | PRN

## 2016-12-14 MED ORDER — BENZONATATE 100 MG PO CAPS: 100.0000 mg | ORAL_CAPSULE | Freq: Three times a day (TID) | ORAL | 0 refills | Status: DC

## 2016-12-14 MED ORDER — AZITHROMYCIN 250 MG PO TABS: 250.0000 mg | ORAL_TABLET | Freq: Every day | ORAL | 0 refills | Status: DC

## 2016-12-14 NOTE — ED Triage Notes (Signed)
Dry cough x 1 month, intermittent body aches

## 2016-12-14 NOTE — ED Provider Notes (Signed)
CSN: [id number]     Arrival date & time 12/14/16  0920 History   First MD Initiated Contact with Patient 12/14/16 1015     Chief Complaint  Patient presents with  . Cough   (Consider location/radiation/quality/duration/timing/severity/associated sxs/prior Treatment) HPI  Andrew Hanson is a 60 y.o. male presenting to UC with c/o 1 month of gradually worsening mainly dry cough, occasionally productive with intermittent body aches and chest tightness. Hx of bronchitis in the past, and improved with an inhaler at that time.  He has been taking OTC cough/cold medication with minimal relief. Denies hx of asthma or COPD. No known sick contacts. Denies fever, chills, n/v/d.    Past Medical History:  Diagnosis Date  . Diabetes mellitus without complication (HCC)   . Hyperlipidemia   . Hypertension    History reviewed. No pertinent surgical history. Family History  Problem Relation Age of Onset  . Alcoholism    . Depression    . Hyperlipidemia    . Hypertension     Social History  Substance Use Topics  . Smoking status: Current Every Day Smoker  . Smokeless tobacco: Never Used  . Alcohol use No    Review of Systems  Constitutional: Negative for chills and fever.  HENT: Positive for congestion. Negative for ear pain, sore throat, trouble swallowing and voice change.   Respiratory: Positive for cough. Negative for shortness of breath.   Cardiovascular: Negative for chest pain and palpitations.  Gastrointestinal: Negative for abdominal pain, diarrhea, nausea and vomiting.  Musculoskeletal: Positive for arthralgias and myalgias. Negative for back pain.  Skin: Negative for rash.  Neurological: Negative for dizziness, light-headedness and headaches.    Allergies  Metformin and related and Statins  Home Medications   Prior to Admission medications   Medication Sig Start Date End Date Taking? Authorizing Provider  DM-Doxylamine-Acetaminophen (NYQUIL COLD & FLU PO) Take by mouth.    Yes Historical Provider, MD  pseudoephedrine-acetaminophen (TYLENOL SINUS) 30-500 MG TABS tablet Take 1 tablet by mouth every 4 (four) hours as needed.   Yes Historical Provider, MD  albuterol (PROVENTIL HFA;VENTOLIN HFA) 108 (90 Base) MCG/ACT inhaler Inhale 1-2 puffs into the lungs every 6 (six) hours as needed for wheezing or shortness of breath. 12/14/16   Junius FinnerErin O'Malley, PA-C  azithromycin (ZITHROMAX) 250 MG tablet Take 1 tablet (250 mg total) by mouth daily. Take first 2 tablets together, then 1 every day until finished. 12/14/16   Junius FinnerErin O'Malley, PA-C  benzonatate (TESSALON) 100 MG capsule Take 1-2 capsules (100-200 mg total) by mouth every 8 (eight) hours. 12/14/16   Junius FinnerErin O'Malley, PA-C  Cholecalciferol (VITAMIN D3) 2000 UNITS TABS Take by mouth 2 (two) times daily.    Historical Provider, MD  dapagliflozin propanediol (FARXIGA) 5 MG TABS tablet Take 5 mg by mouth daily. 07/19/16   Sean Hommel, DO  lisinopril (PRINIVIL,ZESTRIL) 20 MG tablet Take 1 tablet (20 mg total) by mouth daily. 11/26/16   Rodolph BongEvan S Corey, MD  omeprazole (PRILOSEC) 20 MG capsule Take 1 capsule (20 mg total) by mouth 2 (two) times daily before a meal. 10/21/16   Rodolph BongEvan S Corey, MD  Pitavastatin Calcium 4 MG TABS Take 1 tablet (4 mg total) by mouth daily. 10/22/16   Rodolph BongEvan S Corey, MD   Meds Ordered and Administered this Visit  Medications - No data to display  BP 137/85 (BP Location: Left Arm)   Pulse 76   Temp 98.5 F (36.9 C) (Oral)   Ht 6\' 2"  (1.88 m)  Wt 257 lb (116.6 kg)   SpO2 98%   BMI 33.00 kg/m  No data found.   Physical Exam  Constitutional: He appears well-developed and well-nourished. No distress.  HENT:  Head: Normocephalic and atraumatic.  Right Ear: Tympanic membrane normal.  Left Ear: Tympanic membrane normal.  Nose: Nose normal.  Mouth/Throat: Uvula is midline, oropharynx is clear and moist and mucous membranes are normal.  Eyes: Conjunctivae are normal. No scleral icterus.  Neck: Normal range of  motion. Neck supple.  Cardiovascular: Normal rate, regular rhythm and normal heart sounds.   Pulmonary/Chest: Effort normal. No stridor. No respiratory distress. He has wheezes. He has rhonchi. He has no rales.  Faint diffuse wheeze and rhonchi. No respiratory distress.   Musculoskeletal: Normal range of motion.  Lymphadenopathy:    He has no cervical adenopathy.  Neurological: He is alert.  Skin: Skin is warm and dry. He is not diaphoretic.  Nursing note and vitals reviewed.   Urgent Care Course   Clinical Course     Procedures (including critical care time)  Labs Review Labs Reviewed - No data to display  Imaging Review No results found.   MDM   1. Acute bronchitis, unspecified organism   2. Persistent cough for 3 weeks or longer    Pt c/o 1 month of dry cough, occasionally productive. Faint wheeze and rhonchi noted on exam. Will cover for bacterial bronchitis.  Rx: Azithromycin, albuterol, and tessalon F/u with PCP in 1 week if not improving, sooner if worsening.     Junius Finnerrin O'Malley, PA-C 12/14/16 1106

## 2016-12-23 NOTE — Progress Notes (Signed)
Referring: Clementeen Grahamorey, Evan MD  HPI: 61 year old male for evaluation of chest pain. Chest x-ray November 2017 negative. Creatinine 1.48. LFTs and hemoglobin normal. Patient has had chest pain for quite some time. It is continuous in the left breast area. It increases with certain movements. It increases with cough. No radiation or associated symptoms. He denies dyspnea on exertion, orthopnea, PND, pedal edema, syncope or exertional chest pain. Because of the above we were asked to evaluate.  Current Outpatient Prescriptions  Medication Sig Dispense Refill  . albuterol (PROVENTIL HFA;VENTOLIN HFA) 108 (90 Base) MCG/ACT inhaler Inhale 1-2 puffs into the lungs every 6 (six) hours as needed for wheezing or shortness of breath. 1 Inhaler 0  . azithromycin (ZITHROMAX) 250 MG tablet Take 1 tablet (250 mg total) by mouth daily. Take first 2 tablets together, then 1 every day until finished. 6 tablet 0  . lisinopril (PRINIVIL,ZESTRIL) 20 MG tablet Take 1 tablet (20 mg total) by mouth daily. 90 tablet 0  . Multiple Vitamin (MULTIVITAMIN) tablet Take 1 tablet by mouth daily.    Marland Kitchen. omeprazole (PRILOSEC) 20 MG capsule Take 1 capsule (20 mg total) by mouth 2 (two) times daily before a meal. 180 capsule 3   No current facility-administered medications for this visit.     Allergies  Allergen Reactions  . Metformin And Related     diarrhea  . Statins     cramping  . Varenicline     Depression  . Atorvastatin Other (See Comments)    Muscle pain     Past Medical History:  Diagnosis Date  . Diabetes mellitus without complication (HCC)   . Hyperlipidemia   . Hypertension     Past Surgical History:  Procedure Laterality Date  . No surgery in past      Social History   Social History  . Marital status: Married    Spouse name: N/A  . Number of children: 3  . Years of education: N/A   Occupational History  . Not on file.   Social History Main Topics  . Smoking status: Current Every Day  Smoker  . Smokeless tobacco: Never Used  . Alcohol use No  . Drug use: No     Comment: Cocaine years ago  . Sexual activity: No   Other Topics Concern  . Not on file   Social History Narrative  . No narrative on file    Family History  Problem Relation Age of Onset  . Alcoholism    . Depression    . Hyperlipidemia    . Hypertension    . Heart disease Mother   . Heart attack Mother     Died at age 61 of MI    ROS: no fevers or chills, productive cough, hemoptysis, dysphasia, odynophagia, melena, hematochezia, dysuria, hematuria, rash, seizure activity, orthopnea, PND, pedal edema, claudication. Remaining systems are negative.  Physical Exam:   Blood pressure (!) 156/89, pulse 98, height 6\' 2"  (1.88 m), weight 249 lb (112.9 kg).  General:  Well developed/well nourished in NAD Skin warm/dry Patient not depressed No peripheral clubbing Back-normal HEENT-normal/normal eyelids Neck supple/normal carotid upstroke bilaterally; no bruits; no JVD; no thyromegaly chest - CTA/ normal expansion CV - RRR/normal S1 and S2; no murmurs, rubs or gallops;  PMI nondisplaced Abdomen -NT/ND, no HSM, no mass, + bowel sounds, no bruit 2+ femoral pulses, no bruits Ext-no edema, chords, 2+ PT Neuro-grossly nonfocal  ECG 10/21/2016-sinus rhythm, left axis deviation, no ST changes.  A/P  1  Chest pain-symptoms are extremely atypical. Likely musculoskeletal in etiology. However he is initiating an exercise program. We will arrange an exercise treadmill for risk stratification prior to beginning.  2 hypertension-blood pressure is elevated. I have asked him to follow this and if it remains elevated to discuss with primary care. His lisinopril could be increased for diuretic added.  3 tobacco abuse-patient counseled on discontinuing.  Olga Millers, MD

## 2016-12-29 ENCOUNTER — Encounter: Payer: Self-pay | Admitting: Cardiology

## 2016-12-29 ENCOUNTER — Ambulatory Visit (INDEPENDENT_AMBULATORY_CARE_PROVIDER_SITE_OTHER): Payer: BLUE CROSS/BLUE SHIELD | Admitting: Cardiology

## 2016-12-29 VITALS — BP 156/89 | HR 98 | Ht 74.0 in | Wt 249.0 lb

## 2016-12-29 DIAGNOSIS — R072 Precordial pain: Secondary | ICD-10-CM | POA: Diagnosis not present

## 2016-12-29 DIAGNOSIS — I10 Essential (primary) hypertension: Secondary | ICD-10-CM | POA: Diagnosis not present

## 2016-12-29 NOTE — Patient Instructions (Signed)
Medication Instructions:   NO CHANGE  Testing/Procedures:  Your physician has requested that you have an exercise tolerance test. For further information please visit [email or web address]. Please also follow instruction sheet, as given.    Follow-Up:  Your physician recommends that you schedule a follow-up appointment in: AS NEEDED PENDING TEST RESULTS    Exercise Stress Echocardiogram An exercise stress echocardiogram is a test that checks how well your heart is working. For this test, you will walk on a treadmill to make your heart beat faster. This test uses sound waves (ultrasound) and a computer to make pictures (images) of your heart. These pictures will be taken before you exercise and after you exercise. What happens before the procedure?  Follow instructions from your doctor about what you cannot eat or drink before the test.  Do not drink or eat anything that has caffeine in it. Stop having caffeine for 24 hours before the test.  Ask your doctor about changing or stopping your normal medicines. This is important if you take diabetes medicines or blood thinners. Ask your doctor if you should take your medicines with water before the test.  If you use an inhaler, bring it to the test.  Do not use any products that have nicotine or tobacco in them, such as cigarettes and e-cigarettes. Stop using them for 4 hours before the test. If you need help quitting, ask your doctor.  Wear comfortable shoes and clothing. What happens during the procedure?  You will be hooked up to a TV screen. Your doctor will watch the screen to see how fast your heart beats during the test.  Before you exercise, a computer will make a picture of your heart. To do this:  A gel will be put on your chest.  A wand will be moved over the gel.  Sound waves from the wand will go to the computer to make the picture.  Your will start walking on a treadmill. The treadmill will start at a slow speed. It  will get faster a little bit at a time. When you walk faster, your heart will beat faster.  The treadmill will be stopped when your heart is working hard.  You will lie down right away so another picture of your heart can be taken.  The test will take 30-60 minutes. What happens after the procedure?  Your heart rate and blood pressure will be watched after the test.  If your doctor says that you can, you may:  Eat what you usually eat.  Do your normal activities.  Take medicines like normal. Summary  An exercise stress echocardiogram is a test that checks how well your heart is working.  Follow instructions about what you cannot eat or drink before the test. Ask your doctor if you should take your normal medicines before the test.  Stop having caffeine for 24 hours before the test. Do not use anything with nicotine or tobacco in it for 4 hours before the test.  A computer will take a picture of your heart before you walk on a treadmill. It will take another picture when you are done walking.  Your heart rate and blood pressure will be watched after the test. This information is not intended to replace advice given to you by your health care provider. Make sure you discuss any questions you have with your health care provider. Document Released: 10/03/2009 Document Revised: 08/29/2016 Document Reviewed: 08/29/2016 Elsevier Interactive Patient Education  2017 [employer]    

## 2017-01-07 ENCOUNTER — Inpatient Hospital Stay (HOSPITAL_COMMUNITY): Admission: RE | Admit: 2017-01-07 | Payer: BLUE CROSS/BLUE SHIELD | Source: Ambulatory Visit

## 2017-01-07 ENCOUNTER — Other Ambulatory Visit: Payer: Self-pay | Admitting: Family Medicine

## 2017-01-18 ENCOUNTER — Telehealth (HOSPITAL_COMMUNITY): Payer: Self-pay

## 2017-01-18 NOTE — Telephone Encounter (Signed)
Encounter complete. 

## 2017-01-20 ENCOUNTER — Ambulatory Visit (HOSPITAL_COMMUNITY)
Admission: RE | Admit: 2017-01-20 | Discharge: 2017-01-20 | Disposition: A | Discharge status: Home / Self Care | Payer: BLUE CROSS/BLUE SHIELD | Source: Ambulatory Visit | Attending: Cardiovascular Disease | Admitting: Cardiovascular Disease

## 2017-01-20 DIAGNOSIS — R072 Precordial pain: Secondary | ICD-10-CM

## 2017-01-20 LAB — EXERCISE TOLERANCE TEST
CHL CUP MPHR: 160 {beats}/min
CHL CUP RESTING HR STRESS: 76 {beats}/min
CSEPEDS: 52 s
CSEPPHR: 139 {beats}/min
Estimated workload: 8.3 METS
Exercise duration (min): 6 min
Percent HR: 86 %
RPE: 18

## 2017-03-03 ENCOUNTER — Other Ambulatory Visit: Payer: Self-pay | Admitting: Family Medicine

## 2017-06-10 ENCOUNTER — Other Ambulatory Visit: Payer: Self-pay | Admitting: Family Medicine

## 2017-08-19 ENCOUNTER — Other Ambulatory Visit: Payer: Self-pay | Admitting: Family Medicine

## 2017-09-07 ENCOUNTER — Encounter: Payer: Self-pay | Admitting: Family Medicine

## 2017-09-07 ENCOUNTER — Ambulatory Visit (INDEPENDENT_AMBULATORY_CARE_PROVIDER_SITE_OTHER): Payer: BLUE CROSS/BLUE SHIELD | Admitting: Family Medicine

## 2017-09-07 VITALS — BP 117/68 | HR 72 | Wt 246.0 lb

## 2017-09-07 DIAGNOSIS — I10 Essential (primary) hypertension: Secondary | ICD-10-CM | POA: Diagnosis not present

## 2017-09-07 DIAGNOSIS — E781 Pure hyperglyceridemia: Secondary | ICD-10-CM

## 2017-09-07 DIAGNOSIS — R6882 Decreased libido: Secondary | ICD-10-CM | POA: Diagnosis not present

## 2017-09-07 DIAGNOSIS — R7303 Prediabetes: Secondary | ICD-10-CM

## 2017-09-07 DIAGNOSIS — M25511 Pain in right shoulder: Secondary | ICD-10-CM | POA: Diagnosis not present

## 2017-09-07 DIAGNOSIS — N183 Chronic kidney disease, stage 3 unspecified: Secondary | ICD-10-CM

## 2017-09-07 MED ORDER — VARENICLINE TARTRATE 0.5 MG X 11 & 1 MG X 42 PO MISC: ORAL | 0 refills | Status: AC
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
## 2017-09-07 NOTE — Patient Instructions (Addendum)
Thank you for coming in today. Call or go to the ER if you develop a large red swollen joint with extreme pain or oozing puss.  Work on the shoulder exercises in the handout.  Recheck in 1 month.  Get fasting labs before then.  We will check on cholesterol, testosterone kidney and anemia.   Take chantix per instructions and recheck in 1 month.   Testosterone injection What is this medicine? TESTOSTERONE (tes TOS ter one) is the main male hormone. It supports normal male development such as muscle growth, facial hair, and deep voice. It is used in males to treat low testosterone levels. This medicine may be used for other purposes; ask your health care provider or pharmacist if you have questions. COMMON BRAND NAME(S): Andro-L.A., Aveed, Delatestryl, Depo-Testosterone, Virilon What should I tell my health care provider before I take this medicine? They need to know if you have any of these conditions: -cancer -diabetes -heart disease -kidney disease -liver disease -lung disease -prostate disease -an unusual or allergic reaction to testosterone, other medicines, foods, dyes, or preservatives -pregnant or trying to get pregnant -breast-feeding How should I use this medicine? This medicine is for injection into a muscle. It is usually given by a health care professional in a hospital or clinic setting. Contact your pediatrician regarding the use of this medicine in children. While this medicine may be prescribed for children as young as 39 years of age for selected conditions, precautions do apply. Overdosage: If you think you have taken too much of this medicine contact a poison control center or emergency room at once. NOTE: This medicine is only for you. Do not share this medicine with others. What if I miss a dose? Try not to miss a dose. Your doctor or health care professional will tell you when your next injection is due. Notify the office if you are unable to keep an  appointment. What may interact with this medicine? -medicines for diabetes -medicines that treat or prevent blood clots like warfarin -oxyphenbutazone -propranolol -steroid medicines like prednisone or cortisone This list may not describe all possible interactions. Give your health care provider a list of all the medicines, herbs, non-prescription drugs, or dietary supplements you use. Also tell them if you smoke, drink alcohol, or use illegal drugs. Some items may interact with your medicine. What should I watch for while using this medicine? Visit your doctor or health care professional for regular checks on your progress. They will need to check the level of testosterone in your blood. This medicine is only approved for use in men who have low levels of testosterone related to certain medical conditions. Heart attacks and strokes have been reported with the use of this medicine. Notify your doctor or health care professional and seek emergency treatment if you develop breathing problems; changes in vision; confusion; chest pain or chest tightness; sudden arm pain; severe, sudden headache; trouble speaking or understanding; sudden numbness or weakness of the face, arm or leg; loss of balance or coordination. Talk to your doctor about the risks and benefits of this medicine. This medicine may affect blood sugar levels. If you have diabetes, check with your doctor or health care professional before you change your diet or the dose of your diabetic medicine. Testosterone injections are not commonly used in women. Women should inform their doctor if they wish to become pregnant or think they might be pregnant. There is a potential for serious side effects to an unborn child. Talk to your  health care professional or pharmacist for more information. Talk with your doctor or health care professional about your birth control options while taking this medicine. This drug is banned from use in athletes by most  athletic organizations. What side effects may I notice from receiving this medicine? Side effects that you should report to your doctor or health care professional as soon as possible: -allergic reactions like skin rash, itching or hives, swelling of the face, lips, or tongue -breast enlargement -breathing problems -changes in emotions or moods -deep or hoarse voice -irregular menstrual periods -signs and symptoms of liver injury like dark yellow or brown urine; general ill feeling or flu-like symptoms; light-colored stools; loss of appetite; nausea; right upper belly pain; unusually weak or tired; yellowing of the eyes or skin -stomach pain -swelling of the ankles, feet, hands -too frequent or persistent erections -trouble passing urine or change in the amount of urine Side effects that usually do not require medical attention (report to your doctor or health care professional if they continue or are bothersome): -acne -change in sex drive or performance -facial hair growth -hair loss -headache This list may not describe all possible side effects. Call your doctor for medical advice about side effects. You may report side effects to FDA at 1-800-FDA-1088. Where should I keep my medicine? Keep out of the reach of children. This medicine can be abused. Keep your medicine in a safe place to protect it from theft. Do not share this medicine with anyone. Selling or giving away this medicine is dangerous and against the law. Store at room temperature between 20 and 25 degrees C (68 and 77 degrees F). Do not freeze. Protect from light. Follow the directions for the product you are prescribed. Throw away any unused medicine after the expiration date. NOTE: This sheet is a summary. It may not cover all possible information. If you have questions about this medicine, talk to your doctor, pharmacist, or health care provider.  2018 Elsevier/Gold Standard (2016-01-10 07:33:55)    Secondary Shoulder  Impingement Syndrome Rehab Ask your health care provider which exercises are safe for you. Do exercises exactly as told by your health care provider and adjust them as directed. It is normal to feel mild stretching, pulling, tightness, or discomfort as you do these exercises, but you should stop right away if you feel sudden pain or your pain gets worse. Do not begin these exercises until told by your health care provider. Stretching and range of motion exercise This exercise warms up your muscles and joints and improves the movement and flexibility of your neck and shoulder. This exercise also helps to relieve pain and stiffness. Exercise A: Cervical side bend  1. Using good posture, sit on a stable chair, or stand up. 2. Without moving your shoulders, slowly tilt your left / right ear toward your left / right shoulder until you feel a stretch in your neck muscles. You should be looking straight ahead. 3. Hold for __________ seconds. 4. Slowly return to the starting position. 5. Repeat on your left / right side. Repeat __________ times. Complete this exercise __________ times a day. Strengthening exercises These exercises build strength and endurance in your shoulder. Endurance is the ability to use your muscles for a long time, even after they get tired. Exercise B: Scapular protraction, supine  1. Lie on your back on a firm surface. Hold a __________ weight in your left / right hand. 2. Raise your left / right arm straight into the  air so your hand is directly above your shoulder joint. 3. Push the weight into the air so your shoulder lifts off of the surface that you are lying on. Do not move your head, neck, or back. 4. Hold for __________ seconds. 5. Slowly return to the starting position. Let your muscles relax completely before you repeat this exercise. Repeat __________ times. Complete this exercise __________ times a day. Exercise C: Scapular retraction  1. Sit in a stable chair  without armrests, or stand. 2. Secure an exercise band to a stable object in front of you so the band is at shoulder height. 3. Hold one end of the exercise band in each hand. Your palms should face down. 4. Squeeze your shoulder blades together and move your elbows slightly behind you. Do not shrug your shoulders while you do this. 5. Hold for __________ seconds. 6. Slowly return to the starting position. Repeat __________ times. Complete this exercise __________ times a day. Exercise D: Shoulder extension with scapular retraction  1. Sit in a stable chair without armrests, or stand. 2. Secure an exercise band to a stable object in front of you where it is above shoulder height. 3. Hold one end of the exercise band in each hand. 4. Straighten your elbows and lift your hands up to shoulder height. 5. Squeeze your shoulder blades together and pull your hands down to the sides of your thighs. Stop when your hands are straight down by your sides. Do not let your hands go behind your body. 6. Hold for __________ seconds. 7. Slowly return to the starting position. Repeat __________ times. Complete this exercise __________ times a day. Exercise E: Shoulder abduction 1. Sit in a stable chair without armrests, or stand. 2. If directed, hold a __________ weight in your left / right hand. 3. Start with your arms straight down. Turn your left / right hand so your palm faces in, toward your body. 4. Slowly lift your left / right hand out to your side. Do not lift your hand above shoulder height. ? Keep your arms straight. ? Avoid shrugging your shoulder while you do this movement. Keep your shoulder blade tucked down toward the middle of your back. 5. Hold for __________ seconds. 6. Slowly lower your arm, and return to the starting position. Repeat __________ times. Complete this exercise __________ times a day. This information is not intended to replace advice given to you by your health care  provider. Make sure you discuss any questions you have with your health care provider. Document Released: 12/06/2005 Document Revised: 08/12/2016 Document Reviewed: 11/08/2015 Elsevier Interactive Patient Education  [employer].

## 2017-09-10 LAB — CBC
HEMATOCRIT: 43.5 % (ref 38.5–50.0)
HEMOGLOBIN: 14.9 g/dL (ref 13.2–17.1)
MCH: 29.4 pg (ref 27.0–33.0)
MCHC: 34.3 g/dL (ref 32.0–36.0)
MCV: 85.8 fL (ref 80.0–100.0)
MPV: 12.7 fL — AB (ref 7.5–12.5)
Platelets: 185 10*3/uL (ref 140–400)
RBC: 5.07 10*6/uL (ref 4.20–5.80)
RDW: 13.7 % (ref 11.0–15.0)
WBC: 4.7 10*3/uL (ref 3.8–10.8)

## 2017-09-10 LAB — LIPID PANEL W/REFLEX DIRECT LDL
CHOL/HDL RATIO: 5.8 (calc) — AB (ref <5.0)
Cholesterol: 168 mg/dL (ref <200)
HDL: 29 mg/dL — AB (ref >40)
LDL Cholesterol (Calc): 106 mg/dL (calc) — ABNORMAL HIGH
NON-HDL CHOLESTEROL (CALC): 139 mg/dL — AB (ref <130)
Triglycerides: 210 mg/dL — ABNORMAL HIGH (ref <150)

## 2017-09-10 LAB — COMPLETE METABOLIC PANEL WITH GFR
AG RATIO: 1.6 (calc) (ref 1.0–2.5)
ALBUMIN MSPROF: 4.5 g/dL (ref 3.6–5.1)
ALT: 27 U/L (ref 9–46)
AST: 17 U/L (ref 10–35)
Alkaline phosphatase (APISO): 52 U/L (ref 40–115)
BUN/Creatinine Ratio: 14 (calc) (ref 6–22)
BUN: 19 mg/dL (ref 7–25)
CALCIUM: 9.5 mg/dL (ref 8.6–10.3)
CO2: 27 mmol/L (ref 20–32)
CREATININE: 1.4 mg/dL — AB (ref 0.70–1.25)
Chloride: 107 mmol/L (ref 98–110)
GFR, EST AFRICAN AMERICAN: 63 mL/min/{1.73_m2} (ref >=60)
GFR, EST NON AFRICAN AMERICAN: 54 mL/min/{1.73_m2} — AB (ref >=60)
GLOBULIN: 2.9 g/dL (ref 1.9–3.7)
Glucose, Bld: 91 mg/dL (ref 65–99)
POTASSIUM: 4 mmol/L (ref 3.5–5.3)
SODIUM: 140 mmol/L (ref 135–146)
TOTAL PROTEIN: 7.4 g/dL (ref 6.1–8.1)
Total Bilirubin: 0.6 mg/dL (ref 0.2–1.2)

## 2017-09-10 LAB — HEMOGLOBIN A1C
HEMOGLOBIN A1C: 5.7 %{Hb} — AB (ref <5.7)
MEAN PLASMA GLUCOSE: 117 (calc)
eAG (mmol/L): 6.5 (calc)

## 2017-09-10 LAB — TESTOSTERONE: TESTOSTERONE: 349 ng/dL (ref 250–827)

## 2017-09-10 LAB — TSH: TSH: 1.18 m[IU]/L (ref 0.40–4.50)

## 2017-09-12 MED ORDER — PRAVASTATIN SODIUM 40 MG PO TABS: 40.0000 mg | ORAL_TABLET | Freq: Every day | ORAL | 0 refills | Status: AC

## 2017-09-12 NOTE — Addendum Note (Signed)
Addended by: Rodolph Bong on: 09/12/2017 07:03 AM   Modules accepted: Orders

## 2017-09-14 ENCOUNTER — Encounter: Payer: BLUE CROSS/BLUE SHIELD | Admitting: Family Medicine

## 2017-09-16 ENCOUNTER — Ambulatory Visit (INDEPENDENT_AMBULATORY_CARE_PROVIDER_SITE_OTHER): Payer: BLUE CROSS/BLUE SHIELD | Admitting: Family Medicine

## 2017-09-16 ENCOUNTER — Encounter: Payer: Self-pay | Admitting: Family Medicine

## 2017-09-16 VITALS — BP 133/78 | HR 69 | Ht 74.0 in | Wt 245.0 lb

## 2017-09-16 DIAGNOSIS — Z0000 Encounter for general adult medical examination without abnormal findings: Secondary | ICD-10-CM | POA: Diagnosis not present

## 2017-09-16 DIAGNOSIS — M25511 Pain in right shoulder: Secondary | ICD-10-CM

## 2017-09-16 NOTE — Patient Instructions (Addendum)
Thank you for coming in today.  Start the chantix.  You should feel much less urge to smoke. I recommend trying to quit a few weeks after starting Chantix.  After 1 month let me know and I will send in months 2 and 3 of chantix.   Start Pravastatin for cholesterol.  Also consider Fish oil or Krill oil over the counter.   You should hear about the MRI soon.  We will discuss the results either in person or over the phone.

## 2017-09-16 NOTE — Progress Notes (Signed)
Andrew Hanson is a 61 y.o. male who presents to [facility]: Primary Care Sports Medicine today for well adult visit.  Andrew Hanson was seen recently for a variety of medical problems. He was identified as having hyperlipidemia with an elevated CVD risk. He was prescribed pravastatin and Chantix to help him quit smoking. He has not started either one of these medications because he would like to talk about them first. Otherwise he takes the medications listed below tolerates them quite well. He's interested in his own health and is trying to get more exercise. He is a lower carbohydrate diet to avoid diabetes. He denies polyuria or polydipsia feels well.  He does however note continued right shoulder pain. He was seen about a week ago for right shoulder pain and had a subacromial bursa injection. This provided excellent pain relief temporarily. The pain has returned. He is concerned he may have a rotator cuff tear and is interested in MRI if possible. The patient has been ongoing now for more than 6 weeks. He has tried and failed to benefit from a home exercise physical therapy program.   Past Medical History:  Diagnosis Date  . Chronic renal insufficiency, stage III (moderate) 04/18/2015  . Hyperlipidemia   . Hypertension   . Prediabetes 01/19/2016   Past Surgical History:  Procedure Laterality Date  . No surgery in past     Social History  Substance Use Topics  . Smoking status: Current Every Day Smoker  . Smokeless tobacco: Never Used  . Alcohol use No   family history includes Alcoholism in his unknown relative; Depression in his unknown relative; Heart attack in his mother; Heart disease in his mother; Hyperlipidemia in his unknown relative; Hypertension in his unknown relative.  ROS as above:  Medications: Current Outpatient Prescriptions  Medication Sig Dispense Refill  . albuterol (PROVENTIL  HFA;VENTOLIN HFA) 108 (90 Base) MCG/ACT inhaler Inhale 1-2 puffs into the lungs every 6 (six) hours as needed for wheezing or shortness of breath. 1 Inhaler 0  . lisinopril (PRINIVIL,ZESTRIL) 20 MG tablet Take 1 tablet (20 mg total) by mouth daily. Due for follow up visit 30 tablet 0  . Multiple Vitamin (MULTIVITAMIN) tablet Take 1 tablet by mouth daily.    Marland Kitchen omeprazole (PRILOSEC) 20 MG capsule Take 1 capsule (20 mg total) by mouth 2 (two) times daily before a meal. 180 capsule 3  . pravastatin (PRAVACHOL) 40 MG tablet Take 1 tablet (40 mg total) by mouth daily. 90 tablet 0  . valACYclovir (VALTREX) 1000 MG tablet take 1 tablet by mouth once daily for 5 days 15 tablet 2  . varenicline (CHANTIX STARTING MONTH PAK) 0.5 MG X 11 & 1 MG X 42 tablet Take one 0.5mg  tablet by mouth once daily for 3 days, then increase to one 0.5mg  tablet twice daily for 3 days, then increase to one  tablet twice daily. 53 tablet 0   No current facility-administered medications for this visit.    Allergies  Allergen Reactions  . Metformin And Related     diarrhea  . Statins     cramping  . Varenicline     Depression  . Atorvastatin Other (See Comments)    Muscle pain    Health Maintenance Health Maintenance  Topic Date Due  . INFLUENZA VACCINE  09/16/2018 (Originally 07/20/2017)  . TETANUS/TDAP  07/11/2021  . COLONOSCOPY  07/03/2022  . Hepatitis C Screening  Completed  . HIV Screening  Completed  Exam:  BP 133/78   Pulse 69   Ht  (1.88 m)   Wt 245 lb (111.1 kg)   BMI 31.46 kg/m  Gen: Well NAD HEENT: EOMI,  MMM Lungs: Normal work of breathing. CTABL Heart: RRR no MRG Abd: NABS, Soft. Nondistended, Nontender Exts: Brisk capillary refill, warm and well perfused.  Skin no concerning lesions Depression screen [facility] 2/9 09/07/2017  Decreased Interest 3  Down, Depressed, Hopeless 1  PHQ - 2 Score 4  Altered sleeping 3  Tired, decreased energy 3  Change in appetite 1  Feeling bad or failure  about yourself  1  Trouble concentrating 3  Moving slowly or fidgety/restless 1  Suicidal thoughts 0  PHQ-9 Score 16   Lab Results  Component Value Date   CHOL 168 09/09/2017   HDL 29 (L) 09/09/2017   LDLCALC 95 10/21/2016   TRIG 210 (H) 09/09/2017   CHOLHDL 5.8 (H) 09/09/2017     Chemistry      Component Value Date/Time   NA 140 09/09/2017 0919   K 4.0 09/09/2017 0919   CL 107 09/09/2017 0919   CO2 27 09/09/2017 0919   BUN 19 09/09/2017 0919   CREATININE 1.40 (H) 09/09/2017 0919      Component Value Date/Time   CALCIUM 9.5 09/09/2017 0919   ALKPHOS 46 10/21/2016 1038   AST 17 09/09/2017 0919   ALT 27 09/09/2017 0919   BILITOT 0.6 09/09/2017 0919        No results found for this or any previous visit (from the past 72 hour(s)). No results found.    Assessment and Plan: 61 y.o. male with  Well adult. Doing reasonably well.  Fasting labs reviewed. Continue low carbohydrate diet. Plan to start pravastatin for cholesterol management and Chantix for smoking cessation. Tinea daily exercise.  Right shoulder pain: We'll obtain MRI as patient has failed conservative management. MRI will be use for potential surgical planning.   Orders Placed This Encounter  Procedures  . MR SHOULDER RIGHT WO CONTRAST    Standing Status:   Future    Standing Expiration Date:   11/16/2018    Order Specific Question:   What is the patient's sedation requirement?    Answer:   No Sedation    Order Specific Question:   Does the patient have a pacemaker or implanted devices?    Answer:   No    Order Specific Question:   Preferred imaging location?    Answer:   [facility] (table limit-350lbs)    Order Specific Question:   Radiology Contrast Protocol - do NOT remove file path    Answer:   \\[email or web address]   No orders of the defined types were placed in this encounter.    Discussed warning signs or symptoms. Please see discharge instructions.  Patient expresses understanding.

## 2017-10-03 ENCOUNTER — Ambulatory Visit (INDEPENDENT_AMBULATORY_CARE_PROVIDER_SITE_OTHER): Payer: BLUE CROSS/BLUE SHIELD

## 2017-10-03 ENCOUNTER — Other Ambulatory Visit: Payer: BLUE CROSS/BLUE SHIELD

## 2017-10-03 DIAGNOSIS — M25511 Pain in right shoulder: Secondary | ICD-10-CM

## 2017-10-03 DIAGNOSIS — M75111 Incomplete rotator cuff tear or rupture of right shoulder, not specified as traumatic: Secondary | ICD-10-CM

## 2017-10-05 ENCOUNTER — Encounter: Payer: Self-pay | Admitting: Family Medicine

## 2017-10-05 ENCOUNTER — Ambulatory Visit (INDEPENDENT_AMBULATORY_CARE_PROVIDER_SITE_OTHER): Payer: BLUE CROSS/BLUE SHIELD | Admitting: Family Medicine

## 2017-10-05 VITALS — BP 126/79 | HR 75 | Wt 245.0 lb

## 2017-10-05 DIAGNOSIS — M75111 Incomplete rotator cuff tear or rupture of right shoulder, not specified as traumatic: Secondary | ICD-10-CM | POA: Diagnosis not present

## 2017-10-05 DIAGNOSIS — M75100 Unspecified rotator cuff tear or rupture of unspecified shoulder, not specified as traumatic: Secondary | ICD-10-CM | POA: Insufficient documentation

## 2017-10-05 DIAGNOSIS — M25511 Pain in right shoulder: Secondary | ICD-10-CM

## 2017-10-05 DIAGNOSIS — M19019 Primary osteoarthritis, unspecified shoulder: Secondary | ICD-10-CM

## 2017-10-05 NOTE — Progress Notes (Signed)
Andrew Hanson is a 61 y.o. male who presents to [facility] today for right shoulder pain. Patient presents to clinic today to follow-up his right shoulder pain. In the interim he had an MRI which shows mild articular surface insertional tear of the supraspinatus tendon, tendinopathy of the subscapularis tendon and acromion clavicular DJD. He has been performing home physical therapy exercises and is not any better. 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 He notes pain is present with overhead motion and internal rotation.   Past Medical History:  Diagnosis Date  . Chronic renal insufficiency, stage III (moderate) 04/18/2015  . Hyperlipidemia   . Hypertension   . Prediabetes 01/19/2016   Past Surgical History:  Procedure Laterality Date  . No surgery in past     Social History  Substance Use Topics  . Smoking status: Current Every Day Smoker  . Smokeless tobacco: Never Used  . Alcohol use No     ROS:  As above   Medications: Current Outpatient Prescriptions  Medication Sig Dispense Refill  . albuterol (PROVENTIL HFA;VENTOLIN HFA) 108 (90 Base) MCG/ACT inhaler Inhale 1-2 puffs into the lungs every 6 (six) hours as needed for wheezing or shortness of breath. 1 Inhaler 0  . lisinopril (PRINIVIL,ZESTRIL) 20 MG tablet Take 1 tablet (20 mg total) by mouth daily. Due for follow up visit 30 tablet 0  . Multiple Vitamin (MULTIVITAMIN) tablet Take 1 tablet by mouth daily.    Marland Kitchen. omeprazole (PRILOSEC) 20 MG capsule Take 1 capsule (20 mg total) by mouth 2 (two) times daily before a meal. 180 capsule 3  . pravastatin (PRAVACHOL) 40 MG tablet Take 1 tablet (40 mg total) by mouth daily. 90 tablet 0  . valACYclovir (VALTREX) 1000 MG tablet take 1 tablet by mouth once daily for 5 days 15 tablet 2  . varenicline (CHANTIX STARTING MONTH PAK) 0.5 MG X 11 & 1 MG X 42 tablet Take one 0.5mg  tablet by mouth once daily for 3 days,  then increase to one 0.5mg  tablet twice daily for 3 days, then increase to one 1mg  tablet twice daily. (Patient not taking: Reported on 10/05/2017) 53 tablet 0   No current facility-administered medications for this visit.    Allergies  Allergen Reactions  . Metformin And Related     diarrhea  . Statins     cramping  . Varenicline     Depression  . Atorvastatin Other (See Comments)    Muscle pain     Exam:  BP 126/79   Pulse 75   Wt 245 lb (111.1 kg)   SpO2 97%   BMI 31.46 kg/m  General: Well Developed, well nourished, and in no acute distress.  Neuro/Psych: Alert and oriented x3, extra-ocular muscles intact, able to move all 4 extremities, sensation grossly intact. Skin: Warm and dry, no rashes noted.  Respiratory: Not using accessory muscles, speaking in full sentences, trachea midline.  Cardiovascular: Pulses palpable, no extremity edema. Abdomen: Does not appear distended. MSK:  Right shoulder normal-appearing Normal range of motion pain with abduction. Tender palpation acromial clavicular joint. Strength is intact to abduction external and internal rotation. Positive empty can test Hawkins and Neer status. Positive crossover arm compression test.    No results found for this or any previous visit (from the past 48 hour(s)). Mr Shoulder Right Wo Contrast  Result Date: 10/03/2017 CLINICAL DATA:  Right shoulder pain with decreased range of motion and weakness. EXAM:  MRI OF THE RIGHT SHOULDER WITHOUT CONTRAST TECHNIQUE: Multiplanar, multisequence MR imaging of the shoulder was performed. No intravenous contrast was administered. COMPARISON:  Radiographs dated 07/15/2015 FINDINGS: Rotator cuff: There is a tiny rim rent partial-thickness articular surface tear of the distal supraspinous tendon seen on image 12 of series 4. There is focal edema in the deep fibers of the distal subscapularis tendon at the insertion on the lesser tuberosity with subcortical edema. Muscles: No  atrophy or abnormal signal of the muscles of the rotator cuff. Biceps long head:  Properly located and intact. Acromioclavicular Joint: Mild AC joint arthropathy. Type 2 acromion. No bursitis. Glenohumeral Joint: No joint effusion. No chondral defect. Labrum:  Intact. Bones:  Extra-articular bones are normal. Other: None IMPRESSION: 1. Tiny with partial-thickness rim rent articular surface tear of the distal supraspinous tendon. 2. Inflammation at the insertion of deep fibers of the distal subscapularis tendon on the lesser tuberosity. 3. Mild AC joint arthropathy. Electronically Signed   By: Francene Boyers M.D.   On: 10/03/2017 16:29      Assessment and Plan: 61 y.o. male with right shoulder pain. Patient had excellent temporary response to subacromial injection that did not provide any lasting benefit.  He has been doing physical therapy exercises at home but is not yet completed formal physical therapy.  He has evidence of some impingement and I think he may benefit from a consultation with orthopedic surgery for a debridement distal clavicle excision etc. to manage his rotator cuff tendinopathy and mild tear. I do think physical therapy is certainly going to be a Central point of improvement. I don't think that further injections are likely to help much as based on improvement temporarily he had accurate placement of the steroids.  Refer to Dr. Magnus Ivan at [facility] orth[facility]ics.    Orders Placed This Encounter  Procedures  . Ambulatory referral to Orthopedic Surgery    Referral Priority:   Routine    Referral Type:   Surgical    Referral Reason:   Specialty Services Required    Referred to Provider:   Kathryne Hitch, MD    Requested Specialty:   Orthopedic Surgery    Number of Visits Requested:   1   No orders of the defined types were placed in this encounter.   Discussed warning signs or symptoms. Please see discharge instructions. Patient expresses understanding.

## 2017-10-05 NOTE — Patient Instructions (Signed)
Thank you for coming in today. You should hear from Dr Magnus IvanBlackman soon about appointment.  Do the home PT exercises.  Recheck with me as needed.

## 2017-10-10 ENCOUNTER — Ambulatory Visit (INDEPENDENT_AMBULATORY_CARE_PROVIDER_SITE_OTHER): Payer: BLUE CROSS/BLUE SHIELD | Admitting: Physician Assistant

## 2017-10-10 ENCOUNTER — Encounter (INDEPENDENT_AMBULATORY_CARE_PROVIDER_SITE_OTHER): Payer: Self-pay | Admitting: Physician Assistant

## 2017-10-10 VITALS — Ht 73.0 in | Wt 245.0 lb

## 2017-10-10 DIAGNOSIS — M7541 Impingement syndrome of right shoulder: Secondary | ICD-10-CM

## 2017-10-10 NOTE — Progress Notes (Signed)
Office Visit Note   Patient: Andrew Hanson           Date of Birth: [birth year]           MRN: [id number] Visit Date: 10/10/2017              Requested by: Rodolph Bong, MD [address] [place], [place] [address] PCP: Rodolph Bong, MD   Assessment & Plan: Visit Diagnoses:  1. Impingement syndrome of right shoulder     Plan: Patient has failed conservative treatment of the right shoulder including time, injections, and therapy.  Therefore due to his continued right shoulder pain recommend shoulder arthroscopy with extensive debridement, subacromial decompression and possible rotator cuff repair.  Risks benefits of surgery discussed with patient by Dr. Magnus Ivan and myself.  Postoperative protocol discussed with him both for rotator cuff repair and debridement shoulder arthroscopy.  He will undergo shoulder arthroscopy with subacromial decompression and possible rotator cuff repair.  Follow-Up Instructions: Return in about 1 week (around 10/17/2017) for post op.   Orders:  No orders of the defined types were placed in this encounter.  No orders of the defined types were placed in this encounter.     Procedures: No procedures performed   Clinical Data: No additional findings.   Subjective: Chief Complaint  Patient presents with  . Right Shoulder - Pain    HPI Andrew Hanson is a 61 year old male who comes in today for right shoulder pain.  Referred by Dr. Denyse Amass for his shoulder pain.  He has had subacromial injection of the shoulder that gave him some relief but this was temporary.  He states in fact it took away all of the shoulder pain for a short period of time however the pain has returned.  She had a home exercise program.  He actually underwent an MRI of his shoulder.  This showed a small partial-thickness rotator cuff tear involving the distal supraspinatus.  Also mild acromioclavicular joint arthropathy.  Type II acromion. He notes that his pain in the  shoulder occurs with certain movements.  He does have pain that goes down his arm.  He describes this is achy pain but at times some numbness down the arm.  Does have some neck pain but does not associate the numbness down the arm with his neck pain.  Associates the numbness down the arm mostly with his shoulder with movement.  Has difficulty sleeping but the pain does not awaken him.  Describes pain to be 10 out of 10 pain at worst in the right shoulder.  He has had no injury.  Pain is been ongoing for the past 2-3 months.  Review of Systems See HPI otherwise negative  Objective: Vital Signs: Ht 6\' 1"  (1.854 m)   Wt 245 lb (111.1 kg)   BMI 32.32 kg/m   Physical Exam  Constitutional: He is oriented to person, place, and time. He appears well-developed and well-nourished. No distress.  Neurological: He is alert and oriented to person, place, and time.  Skin: He is not diaphoretic.  Psychiatric: He has a normal mood and affect.    Right Shoulder Exam  Right shoulder exam is normal.  Muscle Strength  Internal Rotation: 5/5  External Rotation: 5/5   Tests  Cross Arm: positive Impingement: positive  Other  Sensation: normal Pulse: present  Comments:  Negative empty can test Negative liftoff test   Left Shoulder Exam  Left shoulder exam is normal.  Muscle Strength  Internal Rotation: 5/5  External Rotation: 5/5   Tests  Cross Arm: negative Impingement: negative  Other  Sensation: normal Pulse: present   Comments:  Negative empty can test  negative liftoff test      Specialty Comments:  No specialty comments available.  Imaging: No results found.   PMFS History: Patient Active Problem List   Diagnosis Date Noted  . Rotator cuff tear 10/05/2017  . Right shoulder pain 09/16/2017  . Biological false positive RPR test 08/18/2016  . Prediabetes 01/19/2016  . Hypertriglyceridemia 07/16/2015  . Bilateral renal cysts 04/24/2015  . Chronic renal  insufficiency, stage III (moderate) (HCC) 04/18/2015  . Osteoarthritis 04/18/2015  . Nephrolithiasis 04/18/2015  . Essential hypertension 04/18/2015  . Depression 04/18/2015  . History of hepatitis 04/17/2015  . CN (constipation) 04/17/2015   Past Medical History:  Diagnosis Date  . Chronic renal insufficiency, stage III (moderate) 04/18/2015  . Hyperlipidemia   . Hypertension   . Prediabetes 01/19/2016    Family History  Problem Relation Age of Onset  . Alcoholism Unknown   . Depression Unknown   . Hyperlipidemia Unknown   . Hypertension Unknown   . Heart disease Mother   . Heart attack Mother        Died at age 61 of MI    Past Surgical History:  Procedure Laterality Date  . No surgery in past     Social History   Occupational History  . Not on file.   Social History Main Topics  . Smoking status: Current Every Day Smoker  . Smokeless tobacco: Never Used  . Alcohol use No  . Drug use: No     Comment: Cocaine years ago  . Sexual activity: No

## 2017-10-27 ENCOUNTER — Inpatient Hospital Stay (INDEPENDENT_AMBULATORY_CARE_PROVIDER_SITE_OTHER): Payer: BLUE CROSS/BLUE SHIELD | Admitting: Physician Assistant

## 2017-12-18 ENCOUNTER — Other Ambulatory Visit: Payer: Self-pay | Admitting: Family Medicine

## 2017-12-21 ENCOUNTER — Other Ambulatory Visit: Payer: Self-pay

## 2017-12-21 MED ORDER — OMEPRAZOLE 20 MG PO CPDR: 20.0000 mg | DELAYED_RELEASE_CAPSULE | Freq: Two times a day (BID) | ORAL | 3 refills | Status: DC

## 2018-01-17 ENCOUNTER — Other Ambulatory Visit: Payer: Self-pay | Admitting: Family Medicine

## 2019-02-07 ENCOUNTER — Other Ambulatory Visit: Payer: Self-pay | Admitting: Family Medicine
